# Patient Record
Sex: Female | Born: 1970
Health system: Southern US, Community
[De-identification: ages and names within clinical notes are randomized; demographics above are authoritative.]

## PROBLEM LIST (undated history)

## (undated) DIAGNOSIS — A879 Viral meningitis, unspecified: Secondary | ICD-10-CM

## (undated) DIAGNOSIS — E041 Nontoxic single thyroid nodule: Secondary | ICD-10-CM

## (undated) DIAGNOSIS — D219 Benign neoplasm of connective and other soft tissue, unspecified: Secondary | ICD-10-CM

## (undated) DIAGNOSIS — T7840XA Allergy, unspecified, initial encounter: Secondary | ICD-10-CM

## (undated) HISTORY — DX: Allergy, unspecified, initial encounter: T78.40XA

## (undated) HISTORY — PX: BIOPSY THYROID: PRO38

## (undated) HISTORY — DX: Viral meningitis, unspecified: A87.9

## (undated) HISTORY — DX: Benign neoplasm of connective and other soft tissue, unspecified: D21.9

## (undated) HISTORY — DX: Nontoxic single thyroid nodule: E04.1

---
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
## 1997-10-10 ENCOUNTER — Encounter: Admission: RE | Admit: 1997-10-10 | Discharge: 1998-01-08 | Payer: Self-pay | Admitting: *Deleted

## 1998-03-05 ENCOUNTER — Other Ambulatory Visit: Admission: RE | Admit: 1998-03-05 | Discharge: 1998-03-05 | Payer: Self-pay | Admitting: Obstetrics and Gynecology

## 1999-05-27 ENCOUNTER — Other Ambulatory Visit: Admission: RE | Admit: 1999-05-27 | Discharge: 1999-05-27 | Payer: Self-pay | Admitting: Obstetrics and Gynecology

## 2000-01-10 ENCOUNTER — Inpatient Hospital Stay (HOSPITAL_COMMUNITY): Admission: AD | Admit: 2000-01-10 | Discharge: 2000-01-10 | Payer: Self-pay | Admitting: Obstetrics & Gynecology

## 2000-06-14 ENCOUNTER — Other Ambulatory Visit: Admission: RE | Admit: 2000-06-14 | Discharge: 2000-06-14 | Payer: Self-pay | Admitting: Obstetrics and Gynecology

## 2001-05-17 HISTORY — PX: BREAST BIOPSY: SHX20

## 2001-08-10 ENCOUNTER — Other Ambulatory Visit: Admission: RE | Admit: 2001-08-10 | Discharge: 2001-08-10 | Payer: Self-pay | Admitting: Obstetrics and Gynecology

## 2001-08-18 ENCOUNTER — Encounter: Payer: Self-pay | Admitting: *Deleted

## 2001-08-18 ENCOUNTER — Encounter: Admission: RE | Admit: 2001-08-18 | Discharge: 2001-08-18 | Payer: Self-pay | Admitting: *Deleted

## 2001-09-05 ENCOUNTER — Encounter (INDEPENDENT_AMBULATORY_CARE_PROVIDER_SITE_OTHER): Payer: Self-pay | Admitting: *Deleted

## 2001-09-05 ENCOUNTER — Encounter: Admission: RE | Admit: 2001-09-05 | Discharge: 2001-09-05 | Payer: Self-pay | Admitting: *Deleted

## 2001-09-05 ENCOUNTER — Ambulatory Visit (HOSPITAL_BASED_OUTPATIENT_CLINIC_OR_DEPARTMENT_OTHER): Admission: RE | Admit: 2001-09-05 | Discharge: 2001-09-05 | Payer: Self-pay | Admitting: *Deleted

## 2001-09-05 ENCOUNTER — Encounter: Payer: Self-pay | Admitting: *Deleted

## 2002-03-01 ENCOUNTER — Ambulatory Visit (HOSPITAL_COMMUNITY): Admission: RE | Admit: 2002-03-01 | Discharge: 2002-03-01 | Payer: Self-pay | Admitting: Family Medicine

## 2002-03-09 ENCOUNTER — Encounter: Admission: RE | Admit: 2002-03-09 | Discharge: 2002-03-09 | Payer: Self-pay

## 2002-08-27 ENCOUNTER — Encounter: Admission: RE | Admit: 2002-08-27 | Discharge: 2002-08-27 | Payer: Self-pay | Admitting: *Deleted

## 2002-08-27 ENCOUNTER — Encounter: Payer: Self-pay | Admitting: *Deleted

## 2002-08-30 ENCOUNTER — Other Ambulatory Visit: Admission: RE | Admit: 2002-08-30 | Discharge: 2002-08-30 | Payer: Self-pay | Admitting: Obstetrics and Gynecology

## 2003-01-16 ENCOUNTER — Encounter: Admission: RE | Admit: 2003-01-16 | Discharge: 2003-04-16 | Payer: Self-pay

## 2003-02-27 ENCOUNTER — Encounter: Payer: Self-pay | Admitting: *Deleted

## 2003-02-27 ENCOUNTER — Encounter: Admission: RE | Admit: 2003-02-27 | Discharge: 2003-02-27 | Payer: Self-pay | Admitting: *Deleted

## 2003-08-06 ENCOUNTER — Encounter: Admission: RE | Admit: 2003-08-06 | Discharge: 2003-08-06 | Payer: Self-pay | Admitting: Family Medicine

## 2003-09-09 ENCOUNTER — Encounter: Admission: RE | Admit: 2003-09-09 | Discharge: 2003-09-09 | Payer: Self-pay | Admitting: Obstetrics and Gynecology

## 2003-09-12 ENCOUNTER — Encounter: Admission: RE | Admit: 2003-09-12 | Discharge: 2003-09-12 | Payer: Self-pay | Admitting: Family Medicine

## 2003-09-17 ENCOUNTER — Other Ambulatory Visit: Admission: RE | Admit: 2003-09-17 | Discharge: 2003-09-17 | Payer: Self-pay | Admitting: Obstetrics and Gynecology

## 2003-10-22 ENCOUNTER — Encounter: Admission: RE | Admit: 2003-10-22 | Discharge: 2003-10-22 | Payer: Self-pay | Admitting: Family Medicine

## 2003-10-23 ENCOUNTER — Encounter: Admission: RE | Admit: 2003-10-23 | Discharge: 2003-10-23 | Payer: Self-pay | Admitting: Family Medicine

## 2003-10-29 ENCOUNTER — Encounter (INDEPENDENT_AMBULATORY_CARE_PROVIDER_SITE_OTHER): Payer: Self-pay | Admitting: Specialist

## 2003-10-29 ENCOUNTER — Inpatient Hospital Stay (HOSPITAL_COMMUNITY): Admission: RE | Admit: 2003-10-29 | Discharge: 2003-10-31 | Payer: Self-pay | Admitting: Obstetrics and Gynecology

## 2003-10-29 DIAGNOSIS — D219 Benign neoplasm of connective and other soft tissue, unspecified: Secondary | ICD-10-CM

## 2003-10-29 HISTORY — PX: ABDOMINAL HYSTERECTOMY: SHX81

## 2003-10-29 HISTORY — DX: Benign neoplasm of connective and other soft tissue, unspecified: D21.9

## 2005-03-26 ENCOUNTER — Encounter: Admission: RE | Admit: 2005-03-26 | Discharge: 2005-03-26 | Payer: Self-pay | Admitting: Obstetrics and Gynecology

## 2006-03-07 ENCOUNTER — Encounter: Admission: RE | Admit: 2006-03-07 | Discharge: 2006-03-07 | Payer: Self-pay | Admitting: Gastroenterology

## 2006-04-04 ENCOUNTER — Encounter: Admission: RE | Admit: 2006-04-04 | Discharge: 2006-04-04 | Payer: Self-pay | Admitting: Family Medicine

## 2006-06-25 ENCOUNTER — Emergency Department (HOSPITAL_COMMUNITY): Admission: EM | Admit: 2006-06-25 | Discharge: 2006-06-25 | Payer: Self-pay | Admitting: Family Medicine

## 2007-06-20 ENCOUNTER — Encounter: Admission: RE | Admit: 2007-06-20 | Discharge: 2007-06-20 | Payer: Self-pay | Admitting: Obstetrics & Gynecology

## 2007-12-07 ENCOUNTER — Other Ambulatory Visit: Admission: RE | Admit: 2007-12-07 | Discharge: 2007-12-07 | Payer: Self-pay | Admitting: Obstetrics and Gynecology

## 2008-07-26 ENCOUNTER — Encounter: Admission: RE | Admit: 2008-07-26 | Discharge: 2008-07-26 | Payer: Self-pay | Admitting: Family Medicine

## 2008-07-26 DIAGNOSIS — E041 Nontoxic single thyroid nodule: Secondary | ICD-10-CM

## 2008-07-26 HISTORY — DX: Nontoxic single thyroid nodule: E04.1

## 2008-07-29 ENCOUNTER — Encounter: Admission: RE | Admit: 2008-07-29 | Discharge: 2008-07-29 | Payer: Self-pay | Admitting: Obstetrics & Gynecology

## 2008-08-14 ENCOUNTER — Encounter (INDEPENDENT_AMBULATORY_CARE_PROVIDER_SITE_OTHER): Payer: Self-pay | Admitting: Diagnostic Radiology

## 2008-08-14 ENCOUNTER — Ambulatory Visit (HOSPITAL_COMMUNITY): Admission: RE | Admit: 2008-08-14 | Discharge: 2008-08-14 | Payer: Self-pay | Admitting: Internal Medicine

## 2008-09-12 ENCOUNTER — Encounter (HOSPITAL_COMMUNITY): Admission: RE | Admit: 2008-09-12 | Discharge: 2008-12-11 | Payer: Self-pay | Admitting: Internal Medicine

## 2008-10-01 ENCOUNTER — Encounter (INDEPENDENT_AMBULATORY_CARE_PROVIDER_SITE_OTHER): Payer: Self-pay | Admitting: Interventional Radiology

## 2008-10-01 ENCOUNTER — Encounter: Admission: RE | Admit: 2008-10-01 | Discharge: 2008-10-01 | Payer: Self-pay | Admitting: Internal Medicine

## 2008-10-01 ENCOUNTER — Other Ambulatory Visit: Admission: RE | Admit: 2008-10-01 | Discharge: 2008-10-01 | Payer: Self-pay | Admitting: Interventional Radiology

## 2009-05-02 HISTORY — PX: AUGMENTATION MAMMAPLASTY: SUR837

## 2010-05-06 ENCOUNTER — Encounter
Admission: RE | Admit: 2010-05-06 | Discharge: 2010-05-06 | Payer: Self-pay | Source: Home / Self Care | Attending: Obstetrics & Gynecology | Admitting: Obstetrics & Gynecology

## 2010-06-07 ENCOUNTER — Encounter: Payer: Self-pay | Admitting: Internal Medicine

## 2010-06-07 ENCOUNTER — Encounter: Payer: Self-pay | Admitting: Obstetrics & Gynecology

## 2010-10-02 NOTE — Op Note (Signed)
NAMESEANNE, Breanna Thompson                        ACCOUNT NO.:  [id number]   MEDICAL RECORD NO.:  [id number]                   PATIENT TYPE:  OUT   LOCATION:  MLAB                                 FACILITY:  [facility]   PHYSICIAN:  Laqueta Linden, M.D.                 DATE OF BIRTH:  05/09/1971   DATE OF PROCEDURE:  10/29/2003  DATE OF DISCHARGE:                                 OPERATIVE REPORT   PREOPERATIVE DIAGNOSES:  Leiomyoma uteri with disabling pain.   POSTOPERATIVE DIAGNOSES:  Leiomyoma uteri with disabling pain, probable  torsion and necrosis of fibroids.   PROCEDURE:  Total abdominal hysterectomy, placement of ON-Q catheters x2.   SURGEON:  Laqueta Linden, M.D.   ASSISTANT:  Andres Ege, M.D.   ANESTHESIA:  General endotracheal.   ESTIMATED BLOOD LOSS:  200 mL.   URINE OUTPUT:  150 mL.   FLUIDS:  1500 mL of crystalloid.   COUNTS:  Correct x2.   COMPLICATIONS:  None.   INDICATIONS FOR PROCEDURE:  Breanna Thompson is a 40 year old, gravida 3, para  1 female with large fibroids symptomatic for severe pain consistent with  necrosis, associated menorrhagia, pressure and unremitting pelvic pressure  who desired definitive surgery.  She had been using Vicodin and subsequently  a fentanyl patch for control of the pain but was basically nonfunctional.  She denied any fevers but did have an elevated white count of 14.  She had a  hemoglobin of 12.4.  She elected definitive surgery as soon as possible.  She was counseled regarding alternatives including uterine artery  embolization for myomectomy and as she desired no future childbearing she  elected to proceed with definitive surgery. She saw the informed consent  film, voiced her understanding and acceptance of all risks including but not  limited to anesthesia, infection, bleeding possibly requiring transfusion,  injury to bowel, bladder, ureters, vessels, nerves, possibility of fistula  formation, other postoperative  complications including DVT, PE, pneumonia,  death and other unnamed risks as well as postoperative expectations  regarding hospital stay, return to work, sexual functioning and menopause.  There are no plans to remove her ovaries unless felt absolutely necessary at  the time of surgery. She understands that she will become immediately  menopausal if this was the case and would need to discuss hormone  replacement therapy at that time. She has seen the informed consent film,  voiced her understanding and acceptance of all risks and agrees to proceed.  She received Ancef 1 g IV antibiotic prophylaxis preoperatively and received  Toradol 30 mg IV and 30 mg IM after induction of anesthesia.   DESCRIPTION OF PROCEDURE:  The patient was taken to the operating room and  after proper identification and consents were ascertained, she was placed on  the operating table in the supine position. After the induction of general  endotracheal anesthesia, she was placed in the frog leg  position and the  abdomen, perineum and vagina were prepped and draped in the routine sterile  fashion.  A transurethral Foley was placed, she was returned to the supine  position. A transverse incision above the level of the patient's pannicular  fold was then carried down to the level of the anterior rectus fascia. The  fascia was incised, rectus muscles separated and parietal peritoneum  elevated and incised. The incision was extended superiorly and inferiorly to  the level of the bladder. The entire abdominal cavity below the umbilicus  was replaced by a multilobulated huge fibroid uterus. There were two large  fibroids on either side, two posteriorly and one on the lower uterine  segment.  Prior to placement of the self retaining retractor, it actually  gave better visualization to actually deliver the uterine fundus with the  bulk of the fibroids through the incision and initiate the operation without  the retractor.  The round ligaments were clamped, cut and suture ligated with  dissection carried forward in the anterior leaf of the broad ligament with  dissection of the bladder off of the lower uterine segment and cervix. A  window was made in the posterior broad ligament and curved Heaney clamps  placed across the proximal uterine ovarian ligament and fallopian tube with  this pedicle cut and doubly ligated with a free tie and a stitch of #0  Vicryl with retention of both adnexa.  Both ovaries appeared normal. There  were dilated vessels in both infundibulopelvic ligaments particularly on the  right.  There did not appear to be any hematoma formation and actually the  varicosities appeared to diminish in size as the procedure continued once  the connection to the uterus was cut; however, they were quite impressively  large.  At this point, the uterine vessels were skeletonized and as the  uterus was further elevated above the abdominal incision, curved Heaney  clamps were able to be placed across the uterine vessels perpendicularly at  the level of the internal os.  The pedicle was cut and suture ligated. A  small portion of cardinal ligament was clamped, cut and ligated such that  the ligature included the uterine vessels such that they were doubly  ligated. At this point, the uterine fundus with the huge fibroids was  transected from the top of the cervix which greatly improved visualization.  The self retaining retractor was then placed, bladder blade and bowel  retractor was placed with several moistened lap packs and the operation  continued. Successive straight Heaney clamps were placed across the cardinal  ligaments bilaterally to the level of the upper vaginal angles which were  clamped, cut and Heaney ligated intact.  The cervix was circumscribed with a  scalpel and the remainder of the specimen removed and sent for final sectioning.  Richardson angled sutures were placed at both upper  vaginal  angles for hemostasis. The remainder of the vaginal cuff was closed with  interrupted figure-of-eight sutures of #0 Vicryl. Several small bleeding  points were cauterized. The ureters were both noted to be of normal course  and caliber and peristalsing normally at the conclusion of the procedure.  The tubes and ovaries were suspended from the ipsilateral round ligaments.  Visualization again revealed the aforementioned varicosities bilaterally,  right greater then left, but no evidence of hematoma formation.  Copious  lavage was again accomplished and all pedicles were observed and noted to be  hemostatic. There was no active bleeding noted, hemostasis was felt  to be  excellent. All lap packs were removed, counts were correct prior to closure  of the abdomen. The parietoperitoneum was closed in a running fashion using  2-0 Vicryl sutures. The rectus muscles were loosely reapproximated in the  midline. Subfascial hemostasis was ascertained. An ON-Q catheter was placed  in the left mid quadrant subfascially along the length of the incision. The  fascia was then closed from both lateral aspects in the midline using a  running suture of #0 Maxon. This catheter was dosed with 10 mL of 1% plain  lidocaine as a loading dose. Subcutaneous hemostasis was ascertained and the  skin was closed with staples. Steri-Strips are applied.  Prior to this, a  subcutaneous ON-Q catheter was placed through the right mid quadrant into  the subcutaneous tissues along the length of the incision. After closure of  the incision, the catheter was dosed with 5 mL of 1% plain Xylocaine. Both  catheters were Steri-Stripped and then OpSite was placed to hold them in  place and were attached to the ON-Q bulb with 0.5% Marcaine at a rate of 2  mL per hour per catheter. Steri-Strips were applied to the skin incision  after placement of skin staples.  A pressure dressing was applied, the patient was stable and  extubated on  transfer to the recovery room. Estimated blood loss 200 mL.  Urine output  150 mL.  Fluids 1500 mL crystalloid, counts correct x2.  The entire specimen  including separate cervix and uterus with multiple fibroids was sent to  pathology for final sectioning.                                               Laqueta Linden, M.D.    LKS/MEDQ  D:  10/29/2003  T:  10/29/2003  Job:  [id number]   cc:   Talmadge Coventry, M.D.  [address]  [place]  [place] [address]  Fax: [phone]

## 2010-10-02 NOTE — Op Note (Signed)
[place]  Patient:    Breanna Thompson, Breanna Thompson Visit Number: [id number] MRN: [id number]          Service Type: DSU Location: [facility] Attending Physician:  Kandis Mannan Dictated by:   Donnie Coffin Samuella Cota, M.D. Proc. Date: 09/05/01 Admit Date:  09/05/2001 Discharge Date: 09/05/2001   CC:         Heather Roberts, M.D.  Richard M. Marcelle Overlie, M.D.   Operative Report  CCS NUMBER:  [id number]  PREOPERATIVE DIAGNOSIS:  Abnormal mammogram, right breast.  POSTOPERATIVE DIAGNOSIS:  Abnormal mammogram, right breast.  OPERATION:  Needle localization and biopsy of right breast.  SURGEON:  Maisie Fus B. Samuella Cota, M.D.  ANESTHESIA:  Xylocaine 1% local.  ANESTHESIOLOGIST AND C.R.N.A.:  Ashley Royalty.  DESCRIPTION OF PROCEDURE:  Patient was taken to the operating room and placed on the table in supine position.  The right breast was prepped and draped as a sterile field.  A wire was coming through the skin at about the 10:30 position.  An elliptical incision was outlined with the skin marker so as to remove the opening in the skin made by the wire.  Xylocaine 1% local was then used to infiltrate the skin and underlying breast tissue.  The elliptical incision was made and using mainly the cautery, a core of breast tissue was taken out using the wire as a guide.  The patient had a very fatty breast with very little bleeding and very little breast tissue.  The core was removed which surrounded the wire and went beyond the tip of the wire.  Specimen was sent to the radiologist for specimen mammography.  While this was being done, bleeding was controlled with the cautery.  Subcutaneous tissue was closed with interrupted sutures of 3-0 Vicryl and the skin was closed with a running subcuticular suture of 4-0 Vicryl.  The radiologist reported that the area of concern had been removed.  The wound was then painted with Benzoin and half-inch Steri-Strips were applied.  Dry sterile  dressing using 4 x 4s, ABD and four-inch Hypafix was applied.  Patient seemed to tolerate procedure well and was taken to the PACU in satisfactory condition. Dictated by:   Donnie Coffin Samuella Cota, M.D. Attending Physician:  Kandis Mannan DD:  09/05/01 TD:  09/07/01 Job: [id number]

## 2011-04-13 ENCOUNTER — Other Ambulatory Visit: Payer: Self-pay | Admitting: Obstetrics and Gynecology

## 2011-04-13 ENCOUNTER — Other Ambulatory Visit: Payer: Self-pay | Admitting: Obstetrics & Gynecology

## 2011-04-13 DIAGNOSIS — Z1231 Encounter for screening mammogram for malignant neoplasm of breast: Secondary | ICD-10-CM

## 2011-04-17 LAB — HM COLONOSCOPY

## 2011-04-19 ENCOUNTER — Other Ambulatory Visit: Payer: Self-pay | Admitting: Family Medicine

## 2011-04-19 DIAGNOSIS — E041 Nontoxic single thyroid nodule: Secondary | ICD-10-CM

## 2011-04-21 ENCOUNTER — Ambulatory Visit
Admission: RE | Admit: 2011-04-21 | Discharge: 2011-04-21 | Disposition: A | Discharge status: Home / Self Care | Payer: BC Managed Care – PPO | Source: Ambulatory Visit | Attending: Family Medicine | Admitting: Family Medicine

## 2011-04-21 DIAGNOSIS — E041 Nontoxic single thyroid nodule: Secondary | ICD-10-CM

## 2011-05-13 ENCOUNTER — Ambulatory Visit: Payer: Self-pay

## 2011-05-20 ENCOUNTER — Ambulatory Visit: Payer: Self-pay

## 2011-05-25 ENCOUNTER — Ambulatory Visit
Admission: RE | Admit: 2011-05-25 | Discharge: 2011-05-25 | Disposition: A | Discharge status: Home / Self Care | Payer: BC Managed Care – PPO | Source: Ambulatory Visit | Attending: Obstetrics & Gynecology | Admitting: Obstetrics & Gynecology

## 2011-05-25 DIAGNOSIS — Z1231 Encounter for screening mammogram for malignant neoplasm of breast: Secondary | ICD-10-CM

## 2012-06-15 ENCOUNTER — Other Ambulatory Visit: Payer: Self-pay | Admitting: Obstetrics & Gynecology

## 2012-06-15 DIAGNOSIS — Z1231 Encounter for screening mammogram for malignant neoplasm of breast: Secondary | ICD-10-CM

## 2012-07-13 ENCOUNTER — Ambulatory Visit
Admission: RE | Admit: 2012-07-13 | Discharge: 2012-07-13 | Disposition: A | Discharge status: Home / Self Care | Payer: BC Managed Care – PPO | Source: Ambulatory Visit | Attending: Obstetrics & Gynecology | Admitting: Obstetrics & Gynecology

## 2012-07-13 DIAGNOSIS — Z1231 Encounter for screening mammogram for malignant neoplasm of breast: Secondary | ICD-10-CM

## 2012-07-17 ENCOUNTER — Other Ambulatory Visit: Payer: Self-pay | Admitting: Obstetrics and Gynecology

## 2012-07-17 DIAGNOSIS — Z1231 Encounter for screening mammogram for malignant neoplasm of breast: Secondary | ICD-10-CM

## 2012-08-21 ENCOUNTER — Ambulatory Visit
Admission: RE | Admit: 2012-08-21 | Discharge: 2012-08-21 | Disposition: A | Discharge status: Home / Self Care | Payer: BC Managed Care – PPO | Source: Ambulatory Visit | Attending: Obstetrics and Gynecology | Admitting: Obstetrics and Gynecology

## 2012-08-21 DIAGNOSIS — Z1231 Encounter for screening mammogram for malignant neoplasm of breast: Secondary | ICD-10-CM

## 2013-01-19 ENCOUNTER — Ambulatory Visit (INDEPENDENT_AMBULATORY_CARE_PROVIDER_SITE_OTHER): Payer: BC Managed Care – PPO | Admitting: Obstetrics and Gynecology

## 2013-01-19 ENCOUNTER — Encounter: Payer: Self-pay | Admitting: Obstetrics and Gynecology

## 2013-01-19 VITALS — BP 100/70 | HR 64 | Ht 63.5 in | Wt 223.5 lb

## 2013-01-19 DIAGNOSIS — R8290 Unspecified abnormal findings in urine: Secondary | ICD-10-CM

## 2013-01-19 DIAGNOSIS — R82998 Other abnormal findings in urine: Secondary | ICD-10-CM

## 2013-01-19 LAB — POCT URINALYSIS DIPSTICK
Blood, UA: NEGATIVE
Ketones, UA: NEGATIVE
Leukocytes, UA: NEGATIVE
Protein, UA: NEGATIVE
pH, UA: 5

## 2013-01-19 NOTE — Patient Instructions (Addendum)
We will send you results from the urine culture through My Chart.

## 2013-01-19 NOTE — Progress Notes (Signed)
Patient ID: Breanna Thompson, female   DOB: Nov 06, 1970, 42 y.o.   MRN: [id number]  42 y.o.   Married    [place]   female   [phone]   here for foul odor in the urine. Status post TAH for fibroids.  Usually when has a UTI develops foul urine odor.  Not consistent.  More noticeable first thing in the am.   This happened in June. Went to Urgent Care and given an antibiotic, but the patient never received a final culture report. Has maybe one UTI per year.   No vaginal drainage or burning. No vaginal bleeding as has had hysterectomy.  No new partners.  Same partner for the last 2 1/2 years.  No douching.    New new meds or diagnoses.  Drinks green tea consistently.  Usually drinks water.    Patient's last menstrual period was 10/20/2003.          Sexually active: yes  The current method of family planning is status post hysterectomy.     Family History  Problem Relation Age of Onset  . Colon cancer Paternal Aunt     dx'd age 8-died age 53  . Colon cancer Paternal Aunt     dx'd 47 stage IV  . Hypertension Mother   . Diabetes Father   . Hypertension Father     There are no active problems to display for this patient.   Past Medical History  Diagnosis Date  . Fibroid     hx of--removed with hyst.  . Viral meningitis 1990    Past Surgical History  Procedure Laterality Date  . Abdominal hysterectomy  10-29-03    TAH d/t fibroids--Dr. Myrlene Broker  . Breast biopsy Right 2003    benign  . Augmentation mammaplasty Bilateral 05-02-2009    w/lift--Saline    Allergies: Review of patient's allergies indicates no known allergies.  Current Outpatient Prescriptions  Medication Sig Dispense Refill  . cholecalciferol (VITAMIN D) 1000 UNITS tablet Take 1,000 Units by mouth 2 (two) times daily.      . Multiple Vitamin (MULTIVITAMIN) capsule Take 1 capsule by mouth daily.       No current facility-administered medications for this visit.    ROS: Pertinent items are noted  in HPI.  Exam:    BP 100/70  Pulse 64  Ht 5' 3.5" (1.613 m)  Wt 223 lb 8 oz (101.379 kg)  BMI 38.97 kg/m2  LMP 10/20/2003   Wt Readings from Last 3 Encounters:  01/19/13 223 lb 8 oz (101.379 kg)     Ht Readings from Last 3 Encounters:  01/19/13 5' 3.5" (1.613 m)    General appearance: alert, cooperative and appears stated age Abdomen: soft, non-tender; bowel sounds normal; no masses,  no organomegaly   Pelvic: External genitalia:  no lesions              Urethra:  normal appearing urethra with no masses, tenderness or lesions              Bartholins and Skenes: normal                 Vagina: normal appearing vagina with normal color and discharge, no lesions              Cervix: absent        Bimanual Exam:  Uterus:   absent  Adnexa: normal adnexa in size, nontender and no masses                                       Wet Prep - pH 4.5, no yeast, clue cells, or trichomonas.  Assessment  Foul odor to the urine. No vaginitis.  Plan  Urine culture. Discussed ingestion of foods or beverages as a source of the odor. Discussed hydration to reduce concentration of the urine.  Return prn.   An After Visit Summary was printed and given to the patient.

## 2013-01-22 ENCOUNTER — Telehealth: Payer: Self-pay

## 2013-01-22 ENCOUNTER — Other Ambulatory Visit: Payer: Self-pay | Admitting: Obstetrics and Gynecology

## 2013-01-22 LAB — URINE CULTURE

## 2013-01-22 MED ORDER — NITROFURANTOIN MONOHYD MACRO 100 MG PO CAPS: 100.0000 mg | ORAL_CAPSULE | Freq: Two times a day (BID) | ORAL | Status: DC

## 2013-01-22 NOTE — Telephone Encounter (Signed)
Patient's pharmacy is [facility] on [address].

## 2013-01-22 NOTE — Telephone Encounter (Signed)
Called patient LMOVM requestion name and # for her pharmacy.

## 2013-02-02 ENCOUNTER — Ambulatory Visit (INDEPENDENT_AMBULATORY_CARE_PROVIDER_SITE_OTHER): Payer: BC Managed Care – PPO | Admitting: *Deleted

## 2013-02-02 VITALS — BP 90/60 | HR 80 | Resp 20 | Ht 63.5 in | Wt 223.0 lb

## 2013-02-02 DIAGNOSIS — N390 Urinary tract infection, site not specified: Secondary | ICD-10-CM

## 2013-02-02 NOTE — Progress Notes (Signed)
Patient ID: Breanna Thompson, female   DOB: [birth year], 42 y.o.   MRN: [id number] Pt arrived for urine TOC.  Pt states she completed antibiotics and feels much better.  Urine odor has gone away.  Sample collected and sent for culture.

## 2013-02-03 LAB — URINE CULTURE: Colony Count: NO GROWTH

## 2013-02-15 ENCOUNTER — Other Ambulatory Visit: Payer: Self-pay | Admitting: Family Medicine

## 2013-02-15 DIAGNOSIS — E042 Nontoxic multinodular goiter: Secondary | ICD-10-CM

## 2013-02-16 ENCOUNTER — Other Ambulatory Visit: Payer: BC Managed Care – PPO

## 2013-03-12 ENCOUNTER — Ambulatory Visit
Admission: RE | Admit: 2013-03-12 | Discharge: 2013-03-12 | Disposition: A | Discharge status: Home / Self Care | Payer: BC Managed Care – PPO | Source: Ambulatory Visit | Attending: Family Medicine | Admitting: Family Medicine

## 2013-03-12 DIAGNOSIS — E042 Nontoxic multinodular goiter: Secondary | ICD-10-CM

## 2013-06-25 ENCOUNTER — Other Ambulatory Visit: Payer: Self-pay | Admitting: Internal Medicine

## 2013-06-25 DIAGNOSIS — E042 Nontoxic multinodular goiter: Secondary | ICD-10-CM

## 2013-06-26 ENCOUNTER — Ambulatory Visit: Payer: Self-pay | Admitting: Nurse Practitioner

## 2013-06-26 ENCOUNTER — Ambulatory Visit (INDEPENDENT_AMBULATORY_CARE_PROVIDER_SITE_OTHER): Payer: BC Managed Care – PPO | Admitting: Certified Nurse Midwife

## 2013-06-26 ENCOUNTER — Encounter: Payer: Self-pay | Admitting: Certified Nurse Midwife

## 2013-06-26 VITALS — BP 100/68 | HR 70 | Resp 16 | Ht 64.0 in | Wt 225.0 lb

## 2013-06-26 DIAGNOSIS — Z0000 Encounter for general adult medical examination without abnormal findings: Secondary | ICD-10-CM

## 2013-06-26 DIAGNOSIS — Z01419 Encounter for gynecological examination (general) (routine) without abnormal findings: Secondary | ICD-10-CM

## 2013-06-26 LAB — POCT URINALYSIS DIPSTICK
BILIRUBIN UA: NEGATIVE
Glucose, UA: NEGATIVE
KETONES UA: NEGATIVE
Leukocytes, UA: NEGATIVE
Nitrite, UA: NEGATIVE
PH UA: 5
Protein, UA: NEGATIVE
RBC UA: NEGATIVE
Urobilinogen, UA: NEGATIVE

## 2013-06-26 NOTE — Progress Notes (Signed)
43 y.o. G3P0021 Married African American Fe here for annual exam. Denies vaginal bleeding or vaginal dryness. Sees PCP annual for aex, labs. Sees Endocrine for follow up of thyroid nodules, with another appointment soon. No health issues today.  Patient's last menstrual period was 10/20/2003.          Sexually active: yes  The current method of family planning is status post hysterectomy.    Exercising: yes  cardio & toning Smoker:  no  Health Maintenance: Pap:  09-17-03 neg MMG:  08-21-2012 neg Colonoscopy: 12/12 normal repeat 7 years BMD:   none TDaP:  2009 Labs: Poct urine-neg Self breast exam: done occ   reports that she has never smoked. She does not have any smokeless tobacco history on file. She reports that she drinks alcohol. She reports that she does not use illicit drugs.  Past Medical History  Diagnosis Date  . Fibroid     hx of--removed with hyst.  . Viral meningitis 1990  . Thyroid nodule     both sides    Past Surgical History  Procedure Laterality Date  . Abdominal hysterectomy  10-29-03    TAH d/t fibroids--Dr. Margaretha Glassing  . Breast biopsy Right 2003    benign  . Augmentation mammaplasty Bilateral 05-02-2009    w/lift--Saline  . Biopsy thyroid      needle biopsy done on rt side    Current Outpatient Prescriptions  Medication Sig Dispense Refill  . Betamethasone Valerate 0.12 % foam 2 (two) times a week.      . cholecalciferol (VITAMIN D) 1000 UNITS tablet Take 1,000 Units by mouth 2 (two) times daily.      . Clindamycin-Benzoyl Per, Refr, gel as needed.      . Multiple Vitamin (MULTIVITAMIN) capsule Take 1 capsule by mouth daily.      . vitamin C (ASCORBIC ACID) 500 MG tablet Take 500 mg by mouth daily.       No current facility-administered medications for this visit.    Family History  Problem Relation Age of Onset  . Colon cancer Paternal Aunt     dx'd age 74-died age 5  . Colon cancer Paternal Aunt     dx'd 74 stage IV  . Hypertension Mother    . Diabetes Father   . Hypertension Father   . Colon cancer Paternal Aunt     ROS:  Pertinent items are noted in HPI.  Otherwise, a comprehensive ROS was negative.  Exam:   BP 100/68  Pulse 70  Resp 16  Ht 5\' 4"  (1.626 m)  Wt 225 lb (102.059 kg)  BMI 38.60 kg/m2  LMP 10/20/2003 Height: 5\' 4"  (162.6 cm)  Ht Readings from Last 3 Encounters:  06/26/13 5\' 4"  (1.626 m)  02/02/13 5' 3.5" (1.613 m)  01/19/13 5' 3.5" (1.613 m)    General appearance: alert, cooperative and appears stated age Head: Normocephalic, without obvious abnormality, atraumatic Neck: no adenopathy, supple, symmetrical, trachea midline and thyroid enlarged and nodular Lungs: clear to auscultation bilaterally Breasts: normal appearance, no masses or tenderness, No nipple retraction or dimpling, No nipple discharge or bleeding, No axillary or supraclavicular adenopathy, augmentation scarring noted, implants feel intact Heart: regular rate and rhythm Abdomen: soft, non-tender; no masses,  no organomegaly Extremities: extremities normal, atraumatic, no cyanosis or edema Skin: Skin color, texture, turgor normal. No rashes or lesions Lymph nodes: Cervical, supraclavicular, and axillary nodes normal. No abnormal inguinal nodes palpated Neurologic: Grossly normal   Pelvic: External genitalia:  no  lesions              Urethra:  normal appearing urethra with no masses, tenderness or lesions              Bartholin's and Skene's: normal                 Vagina: normal appearing vagina with normal color and discharge, no lesions              Cervix: absent              Pap taken: no Bimanual Exam:  Uterus:  uterus absent              Adnexa: normal adnexa and no mass, fullness, tenderness               Rectovaginal: Confirms               Anus:  normal sphincter tone, no lesions  A:  Well Woman with normal exam  Thyroid nodules under follow up with PCP  S/P TAH ovaries retained for fibroids  P:   Reviewed health and  wellness pertinent to exam  Pap smear as per guidelines   Mammogram yearly pap smear not taken today  counseled on breast self exam, mammography screening, menopause, adequate intake of calcium and vitamin D, diet and exercise, Kegel's exercises  return annually or prn  An After Visit Summary was printed and given to the patient.

## 2013-06-26 NOTE — Patient Instructions (Signed)

## 2013-06-29 NOTE — Progress Notes (Signed)
Reviewed personally.  M. Suzanne Brytani Voth, MD.  

## 2013-07-11 ENCOUNTER — Other Ambulatory Visit (HOSPITAL_COMMUNITY)
Admission: RE | Admit: 2013-07-11 | Discharge: 2013-07-11 | Disposition: A | Discharge status: Home / Self Care | Payer: BC Managed Care – PPO | Source: Ambulatory Visit | Attending: Interventional Radiology | Admitting: Interventional Radiology

## 2013-07-11 ENCOUNTER — Ambulatory Visit
Admission: RE | Admit: 2013-07-11 | Discharge: 2013-07-11 | Disposition: A | Discharge status: Home / Self Care | Payer: BC Managed Care – PPO | Source: Ambulatory Visit | Attending: Internal Medicine | Admitting: Internal Medicine

## 2013-07-11 DIAGNOSIS — E049 Nontoxic goiter, unspecified: Secondary | ICD-10-CM | POA: Insufficient documentation

## 2013-07-11 DIAGNOSIS — E042 Nontoxic multinodular goiter: Secondary | ICD-10-CM

## 2013-07-17 ENCOUNTER — Ambulatory Visit: Payer: Self-pay | Admitting: Nurse Practitioner

## 2013-08-09 ENCOUNTER — Other Ambulatory Visit: Payer: Self-pay | Admitting: Obstetrics and Gynecology

## 2013-08-09 ENCOUNTER — Other Ambulatory Visit: Payer: Self-pay

## 2013-08-09 DIAGNOSIS — Z1231 Encounter for screening mammogram for malignant neoplasm of breast: Secondary | ICD-10-CM

## 2013-08-31 ENCOUNTER — Ambulatory Visit
Admission: RE | Admit: 2013-08-31 | Discharge: 2013-08-31 | Disposition: A | Discharge status: Home / Self Care | Payer: BC Managed Care – PPO | Source: Ambulatory Visit

## 2013-08-31 DIAGNOSIS — Z1231 Encounter for screening mammogram for malignant neoplasm of breast: Secondary | ICD-10-CM

## 2014-03-18 ENCOUNTER — Encounter: Payer: Self-pay | Admitting: Certified Nurse Midwife

## 2014-04-05 IMAGING — US US SOFT TISSUE HEAD/NECK
1 series · 14 of 25 positions shown · non-contrast
Comparison: Ultrasound 04/21/2011

CLINICAL DATA: Followup nodules.

EXAM:
THYROID ULTRASOUND
TECHNIQUE: Ultrasound examination of the thyroid gland and adjacent soft
tissues was performed.

[Series 1: us soft tissue head/neck · 0.11mm/px · 14 of 58 slices shown]
[im 1/58]
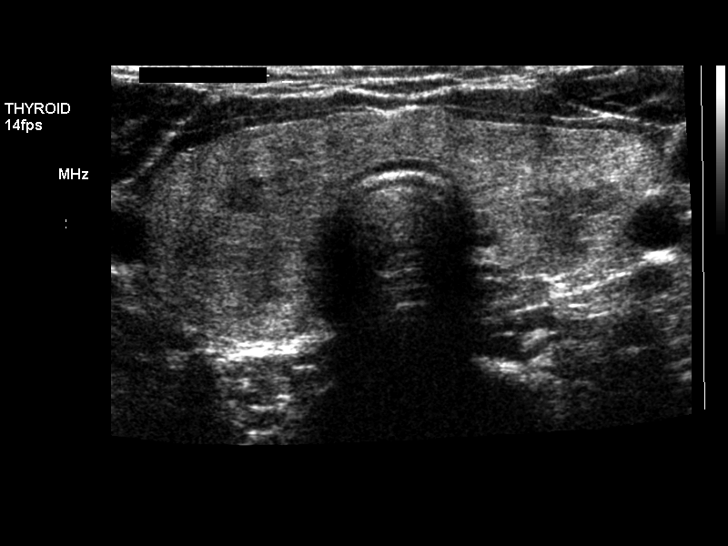
[im 5/58]
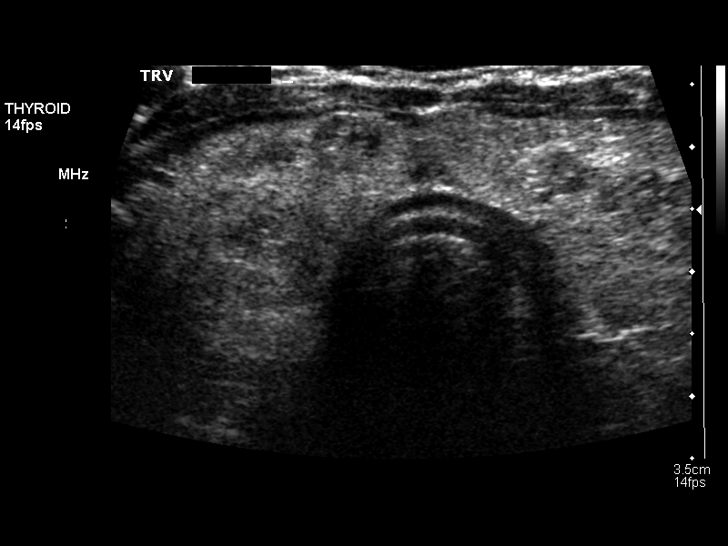
[im 10/58]
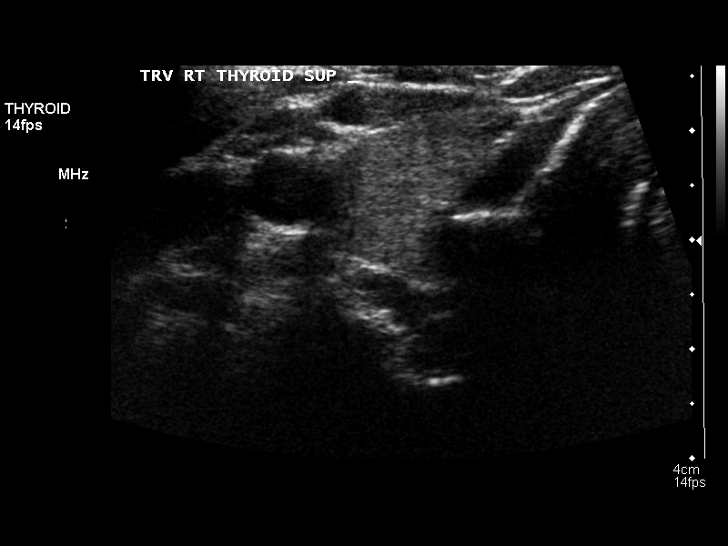
[im 15/58]
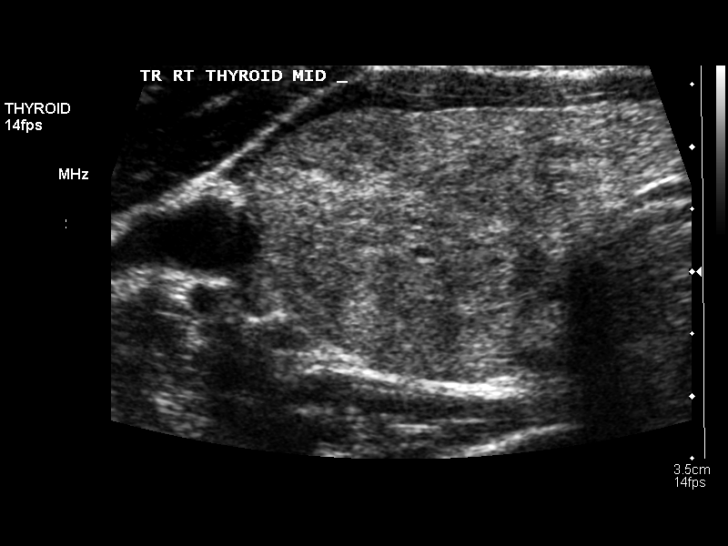
[im 20/58]
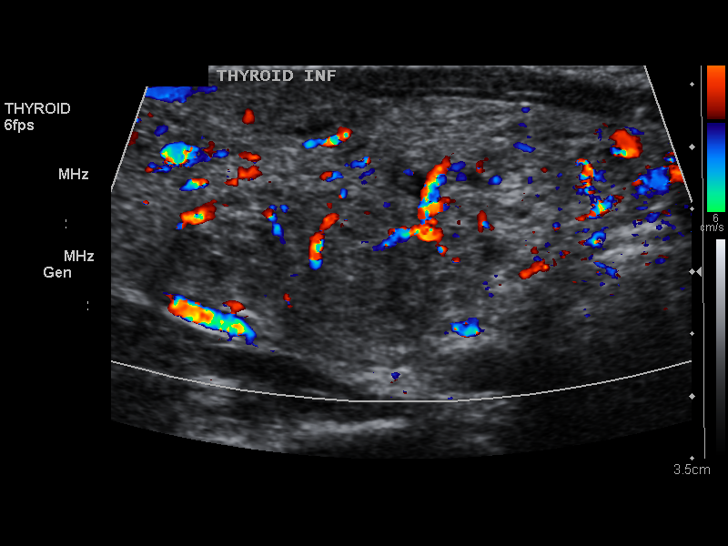
[im 22/58]
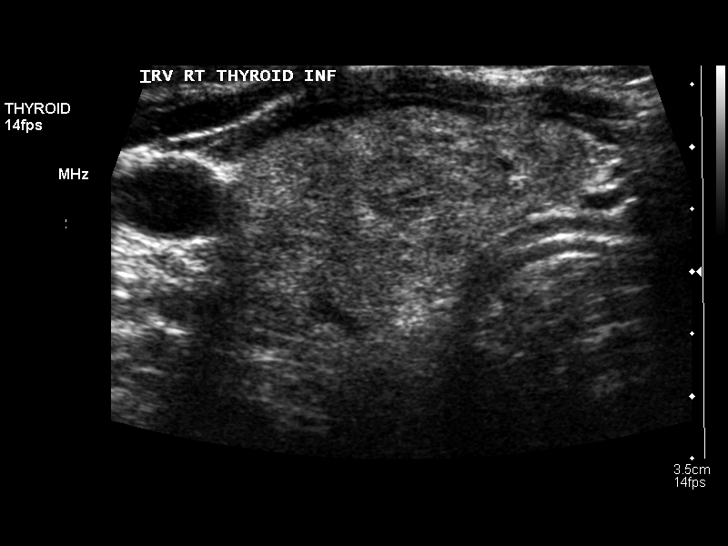
[im 27/58]
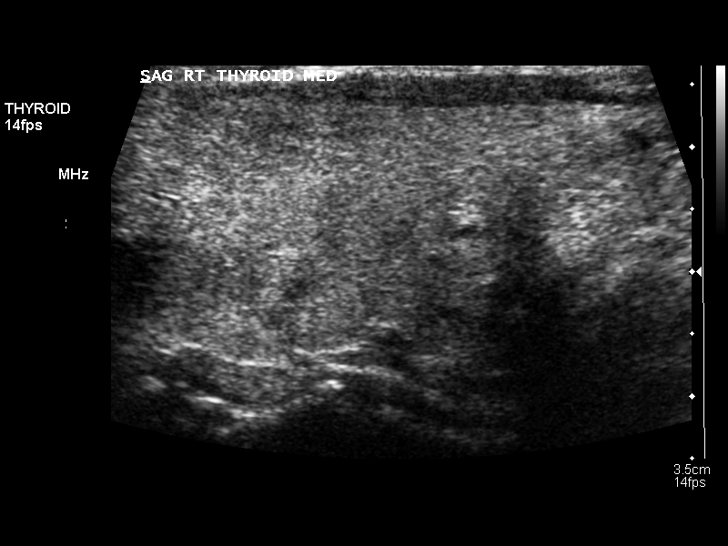
[im 31/58]
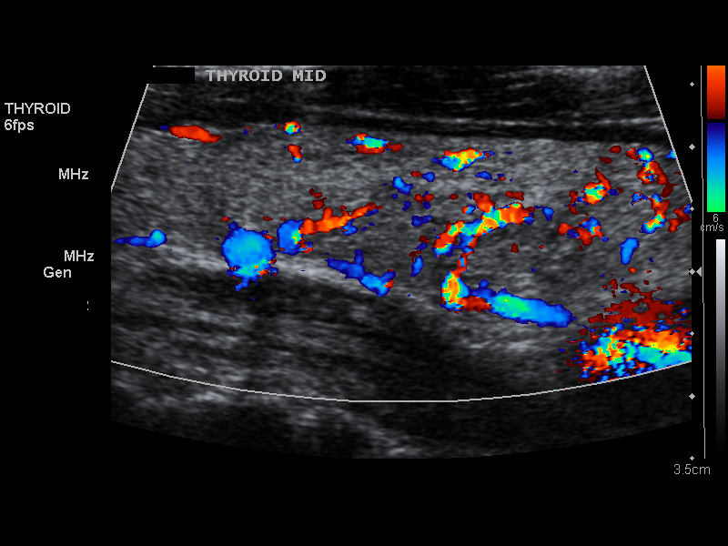
[im 36/58]
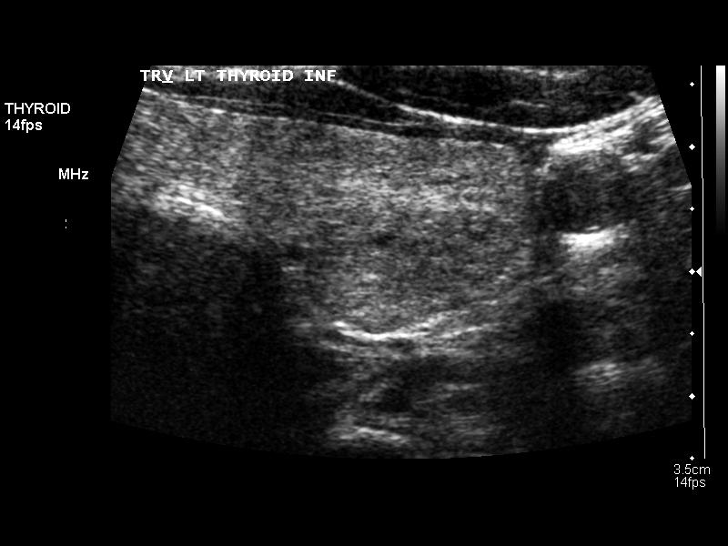
[im 39/58]
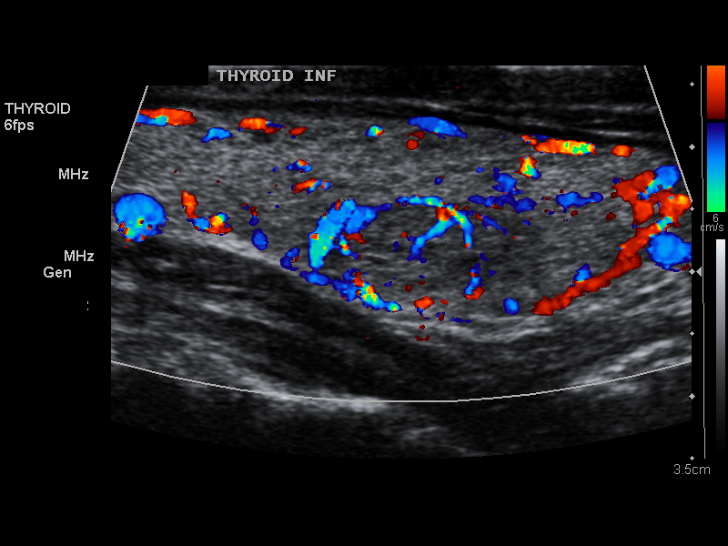
[im 43/58]
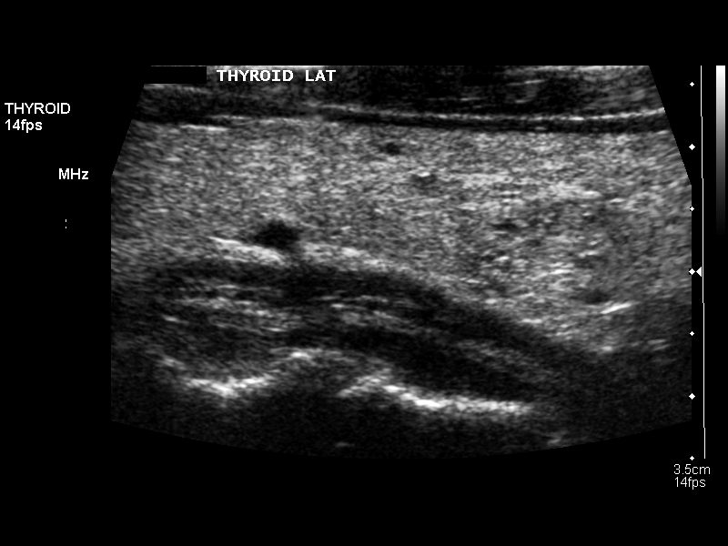
[im 48/58]
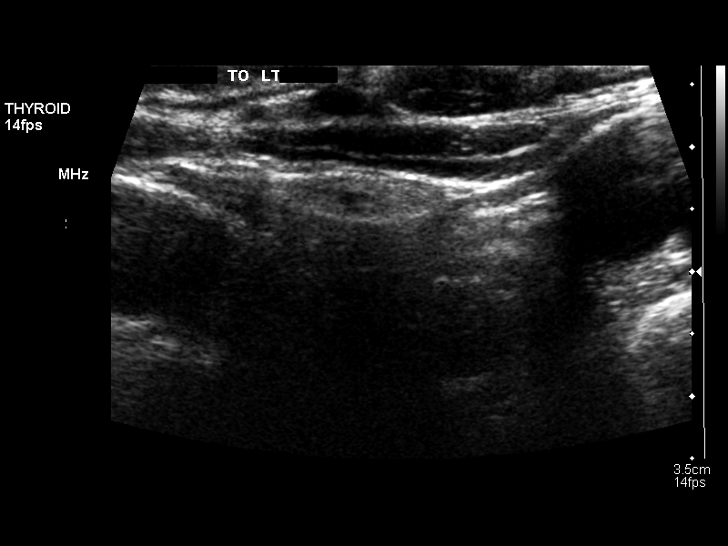
[im 53/58]
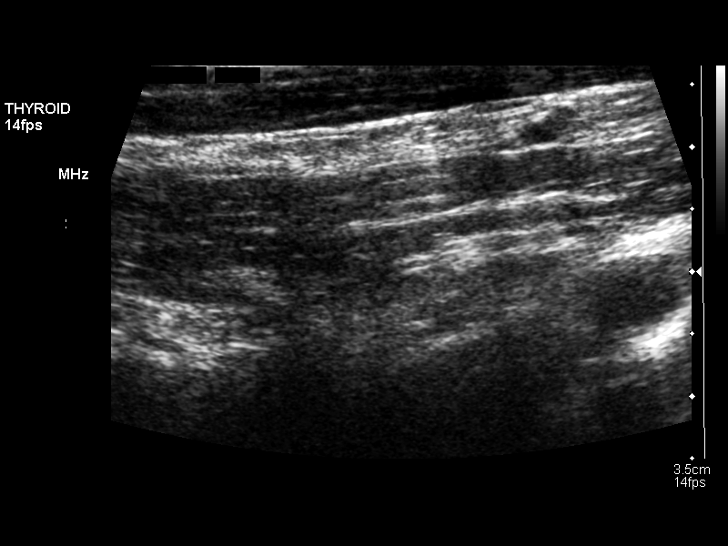
[im 58/58]
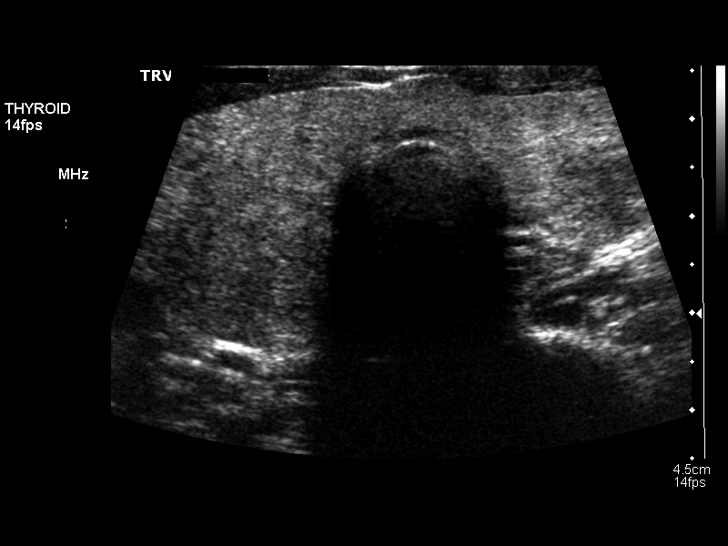

[14 of 25 positions shown; findings below may reference images not displayed]

FINDINGS: Right thyroid lobe

Measurements: 7.1 x 2.3 x 2.6 cm.. Solid nodules are 0.7 cm, 0.9 cm,
and 2.3 x 3.9 x 1.4 cm. The largest is in the lower pole.
Previously, this measured 2.1 x 1.7 x 2.5 cm. This has apparently
been biopsied in [id number].

Left thyroid lobe

Measurements: 6.6 x 1.7 x 2.5 cm.. Solid nodules are 0.8 cm and
x 1.0 x 1.3 cm, previously 1.5 x 0.6 x 1.0 cm.

Isthmus

Thickness: 0.8 cm. Solid nodule is 0.6 cm

Lymphadenopathy

None visualized.
IMPRESSION: 1. Bilateral solid nodules.
2. Correlation is recommended regarding previous cytology report
from right lower lobe nodule biopsy. This nodule is slightly larger
on the current study.
3. Nodule in the lower pole of the left lobe appears stable but does
meet criteria for biopsy. This could be considered if not already
performed.

## 2014-06-26 ENCOUNTER — Other Ambulatory Visit: Payer: Self-pay | Admitting: Internal Medicine

## 2014-06-26 DIAGNOSIS — E042 Nontoxic multinodular goiter: Secondary | ICD-10-CM

## 2014-06-27 ENCOUNTER — Ambulatory Visit
Admission: RE | Admit: 2014-06-27 | Discharge: 2014-06-27 | Disposition: A | Discharge status: Home / Self Care | Payer: BLUE CROSS/BLUE SHIELD | Source: Ambulatory Visit | Attending: Internal Medicine | Admitting: Internal Medicine

## 2014-06-27 ENCOUNTER — Ambulatory Visit (INDEPENDENT_AMBULATORY_CARE_PROVIDER_SITE_OTHER): Payer: BLUE CROSS/BLUE SHIELD | Admitting: Nurse Practitioner

## 2014-06-27 ENCOUNTER — Encounter: Payer: Self-pay | Admitting: Nurse Practitioner

## 2014-06-27 VITALS — BP 100/62 | HR 76 | Ht 64.0 in | Wt 218.0 lb

## 2014-06-27 DIAGNOSIS — Z800 Family history of malignant neoplasm of digestive organs: Secondary | ICD-10-CM

## 2014-06-27 DIAGNOSIS — Z0000 Encounter for general adult medical examination without abnormal findings: Secondary | ICD-10-CM

## 2014-06-27 DIAGNOSIS — Z90710 Acquired absence of both cervix and uterus: Secondary | ICD-10-CM

## 2014-06-27 DIAGNOSIS — E042 Nontoxic multinodular goiter: Secondary | ICD-10-CM

## 2014-06-27 DIAGNOSIS — E041 Nontoxic single thyroid nodule: Secondary | ICD-10-CM

## 2014-06-27 DIAGNOSIS — Z01419 Encounter for gynecological examination (general) (routine) without abnormal findings: Secondary | ICD-10-CM

## 2014-06-27 LAB — POCT URINALYSIS DIPSTICK
BILIRUBIN UA: NEGATIVE
Blood, UA: NEGATIVE
GLUCOSE UA: NEGATIVE
KETONES UA: NEGATIVE
LEUKOCYTES UA: NEGATIVE
NITRITE UA: NEGATIVE
PROTEIN UA: NEGATIVE
UROBILINOGEN UA: NEGATIVE
pH, UA: 5

## 2014-06-27 NOTE — Progress Notes (Signed)
Patient ID: Breanna Thompson, female   DOB: 1970/06/13, 44 y.o.   MRN: [id number] 44 y.o. [id number] Divorced  African American Fe here for annual exam.  No vaso symptoms.  No vaginal dryness.  Same partner 4 years. Endocrinologist is following for thyroid nodules.  Patient's last menstrual period was 10/20/2003.          Sexually active: Yes.    The current method of family planning is status post hysterectomy.    Exercising: No.  The patient does not participate in regular exercise at present. Smoker:  no  Health Maintenance: Pap:  09/17/03, negative (hyst in 10/29/2003) MMG:  08/31/13, Bi-Rads 2:  benign Colonoscopy:   04/2011, normal, repeat 7 years due to family history TDaP:  02/2008 Labs:   HB:  PCP  Urine:  negative   reports that she has never smoked. She does not have any smokeless tobacco history on file. She reports that she drinks alcohol. She reports that she does not use illicit drugs.  Past Medical History  Diagnosis Date  . Fibroid     hx of--removed with hyst.  . Viral meningitis 1990  . Thyroid nodule     both sides    Past Surgical History  Procedure Laterality Date  . Abdominal hysterectomy  10-29-03    TAH d/t fibroids--Dr. Margaretha Glassing  . Breast biopsy Right 2003    benign  . Augmentation mammaplasty Bilateral 05-02-2009    w/lift--[place]  . Biopsy thyroid      needle biopsy done on rt side    Current Outpatient Prescriptions  Medication Sig Dispense Refill  . Betamethasone Valerate 0.12 % foam 2 (two) times a week.    . cholecalciferol (VITAMIN D) 1000 UNITS tablet Take 1,000 Units by mouth 2 (two) times daily.    . Clindamycin-Benzoyl Per, Refr, gel as needed.    . Multiple Vitamin (MULTIVITAMIN) capsule Take 1 capsule by mouth daily.    . vitamin C (ASCORBIC ACID) 500 MG tablet Take 500 mg by mouth daily.     No current facility-administered medications for this visit.    Family History  Problem Relation Age of Onset  . Colon cancer Paternal Aunt      dx'd age 74-died age 26  . Colon cancer Paternal Aunt     dx'd 58 stage IV  . Hypertension Mother   . Diabetes Mother   . Diabetes Father   . Hypertension Father   . Colon cancer Paternal Aunt     ROS:  Pertinent items are noted in HPI.  Otherwise, a comprehensive ROS was negative.  Exam:   BP 100/62 mmHg  Pulse 76  Ht 5\' 4"  (1.626 m)  Wt 218 lb (98.884 kg)  BMI 37.40 kg/m2  LMP 10/20/2003 Height: 5\' 4"  (162.6 cm) Ht Readings from Last 3 Encounters:  06/27/14 5\' 4"  (1.626 m)  06/26/13 5\' 4"  (1.626 m)  02/02/13 5' 3.5" (1.613 m)    General appearance: alert, cooperative and appears stated age Head: Normocephalic, without obvious abnormality, atraumatic Neck: no adenopathy, supple, symmetrical, trachea midline and thyroid normal to inspection and palpation with small nodule Lungs: clear to auscultation bilaterally Breasts: normal appearance, no masses or tenderness, positive findings: implants bilaterally with normal scarring Heart: regular rate and rhythm Abdomen: soft, non-tender; no masses,  no organomegaly Extremities: extremities normal, atraumatic, no cyanosis or edema Skin: Skin color, texture, turgor normal. No rashes or lesions Lymph nodes: Cervical, supraclavicular, and axillary nodes normal. No abnormal inguinal nodes palpated  Neurologic: Grossly normal   Pelvic: External genitalia:  no lesions              Urethra:  normal appearing urethra with no masses, tenderness or lesions              Bartholin's and Skene's: normal                 Vagina: normal appearing vagina with normal color and discharge, no lesions              Cervix: absent              Pap taken: No. Bimanual Exam:  Uterus:  uterus absent              Adnexa: no mass, fullness, tenderness               Rectovaginal: Confirms               Anus:  normal sphincter tone, no lesions  Chaperone present:  no  A:  Well Woman with normal exam  Thyroid nodules under follow up with  PCP S/P TAH 10/29/2003 for fibroids  S/P Breat augmentation with lift 05/02/09  [place]: colon cancer - 3 PA    P:   Reviewed health and wellness pertinent to exam  Pap smear not taken today  Mammogram is due 4/16  Counseled on breast self exam, mammography screening, adequate intake of calcium and vitamin D, diet and exercise return annually or prn  An After Visit Summary was printed and given to the patient.

## 2014-06-27 NOTE — Patient Instructions (Signed)

## 2014-06-28 NOTE — Progress Notes (Signed)
Reviewed personally.  M. Suzanne Denny Mccree, MD.  

## 2014-08-08 ENCOUNTER — Other Ambulatory Visit: Payer: Self-pay

## 2014-08-08 DIAGNOSIS — Z1231 Encounter for screening mammogram for malignant neoplasm of breast: Secondary | ICD-10-CM

## 2014-09-03 ENCOUNTER — Ambulatory Visit
Admission: RE | Admit: 2014-09-03 | Discharge: 2014-09-03 | Disposition: A | Discharge status: Home / Self Care | Payer: BLUE CROSS/BLUE SHIELD | Source: Ambulatory Visit

## 2014-09-03 ENCOUNTER — Other Ambulatory Visit: Payer: Self-pay

## 2014-09-03 DIAGNOSIS — Z1231 Encounter for screening mammogram for malignant neoplasm of breast: Secondary | ICD-10-CM

## 2015-04-26 ENCOUNTER — Ambulatory Visit (INDEPENDENT_AMBULATORY_CARE_PROVIDER_SITE_OTHER): Payer: BLUE CROSS/BLUE SHIELD | Admitting: Emergency Medicine

## 2015-04-26 VITALS — BP 110/80 | HR 75 | Temp 98.3°F | Resp 16 | Ht 64.0 in | Wt 223.0 lb

## 2015-04-26 DIAGNOSIS — H8110 Benign paroxysmal vertigo, unspecified ear: Secondary | ICD-10-CM

## 2015-04-26 DIAGNOSIS — R42 Dizziness and giddiness: Secondary | ICD-10-CM | POA: Diagnosis not present

## 2015-04-26 LAB — COMPLETE METABOLIC PANEL WITH GFR
ALBUMIN: 4 g/dL (ref 3.6–5.1)
ALT: 13 U/L (ref 6–29)
AST: 13 U/L (ref 10–35)
Alkaline Phosphatase: 72 U/L (ref 33–115)
BILIRUBIN TOTAL: 1 mg/dL (ref 0.2–1.2)
BUN: 14 mg/dL (ref 7–25)
CO2: 24 mmol/L (ref 20–31)
Calcium: 8.9 mg/dL (ref 8.6–10.2)
Chloride: 102 mmol/L (ref 98–110)
Creat: 0.74 mg/dL (ref 0.50–1.10)
GFR, Est African American: >89 mL/min (ref >=60)
GLUCOSE: 81 mg/dL (ref 65–99)
Potassium: 4.2 mmol/L (ref 3.5–5.3)
SODIUM: 136 mmol/L (ref 135–146)
TOTAL PROTEIN: 7 g/dL (ref 6.1–8.1)

## 2015-04-26 LAB — POCT CBC
GRANULOCYTE PERCENT: 55.2 % (ref 37–80)
HCT, POC: 40 % (ref 37.7–47.9)
Hemoglobin: 13.8 g/dL (ref 12.2–16.2)
LYMPH, POC: 2.1 (ref 0.6–3.4)
MCH, POC: 31.5 pg — AB (ref 27–31.2)
MCHC: 34.4 g/dL (ref 31.8–35.4)
MCV: 91.5 fL (ref 80–97)
MID (CBC): 0.2 (ref 0–0.9)
MPV: 7.5 fL (ref 0–99.8)
PLATELET COUNT, POC: 243 10*3/uL (ref 142–424)
POC Granulocyte: 2.9 (ref 2–6.9)
POC LYMPH %: 40.6 % (ref 10–50)
POC MID %: 4.2 %M (ref 0–12)
RBC: 4.37 M/uL (ref 4.04–5.48)
RDW, POC: 13.2 %
WBC: 5.2 10*3/uL (ref 4.6–10.2)

## 2015-04-26 LAB — GLUCOSE, POCT (MANUAL RESULT ENTRY): POC GLUCOSE: 91 mg/dL (ref 70–99)

## 2015-04-26 MED ORDER — MECLIZINE HCL 25 MG PO TABS: ORAL_TABLET | ORAL | Status: DC

## 2015-04-26 NOTE — Progress Notes (Signed)
Patient ID: Breanna Thompson, female   DOB: [birth year], 44 y.o.   MRN: [id number]     By signing my name below, I, Zola Button, attest that this documentation has been prepared under the direction and in the presence of Arlyss Queen, MD.  Electronically Signed: Zola Button, Medical Scribe. 04/26/2015. 2:05 PM.   Chief Complaint:  Chief Complaint  Patient presents with  . Dizziness    since yesterday    HPI: Breanna Thompson is a 44 y.o. female who reports to [facility] today complaining of an episode of intermittent dizziness that started yesterday morning. The dizziness is described as a spinning sensation and only occurs with movement. She does not have any dizziness when sitting still. The dizziness has improved since yesterday, but it has not resolved. She reports having associated lightheadedness and left ear fullness. She thought she was starting to have a head cold, so she took some Tylenol Cold tablets yesterday. Patient denies chest pain, headache, weakness, diaphoresis, nausea, change in hearing, and feeling near-syncopal. She denies any known medical problems. She notes that her left ear often has problems with drainage and becoming clogged.  Past Medical History  Diagnosis Date  . Fibroid     hx of--removed with hyst.  . Viral meningitis 1990  . Thyroid nodule     both sides  . Allergy    Past Surgical History  Procedure Laterality Date  . Abdominal hysterectomy  10-29-03    TAH d/t fibroids--Dr. Margaretha Glassing  . Breast biopsy Right 2003    benign  . Augmentation mammaplasty Bilateral 05-02-2009    w/lift--[place]  . Biopsy thyroid      needle biopsy done on rt side   Social History   Social History  . Marital Status: Divorced    Spouse Name: N/A  . Number of Children: N/A  . Years of Education: N/A   Social History Main Topics  . Smoking status: Never Smoker   . Smokeless tobacco: None  . Alcohol Use: Yes     Comment: 1 a month  . Drug Use: No  . Sexual Activity:    Partners: Male    Birth Control/ Protection: Surgical     Comment: TAH--still has ovaries   Other Topics Concern  . None   Social History Narrative   Family History  Problem Relation Age of Onset  . Colon cancer Paternal Aunt     dx'd age 25-died age 65  . Colon cancer Paternal Aunt     dx'd 1 stage IV  . Hypertension Mother   . Diabetes Mother   . Diabetes Father   . Hypertension Father   . Colon cancer Paternal Aunt   . Hypertension Maternal Grandmother   . Cancer Maternal Grandmother   . Hypertension Maternal Grandfather   . Cancer Paternal Grandfather   . Hypertension Paternal Grandfather    No Known Allergies Prior to Admission medications   Medication Sig Start Date End Date Taking? Authorizing Provider  Betamethasone Valerate 0.12 % foam 2 (two) times a week. 06/14/13  Yes Historical Provider, MD  cholecalciferol (VITAMIN D) 1000 UNITS tablet Take 1,000 Units by mouth 2 (two) times daily.   Yes Historical Provider, MD  Clindamycin-Benzoyl Per, Refr, gel as needed. 06/15/13  Yes Historical Provider, MD  Multiple Vitamin (MULTIVITAMIN) capsule Take 1 capsule by mouth daily.   Yes Historical Provider, MD  vitamin C (ASCORBIC ACID) 500 MG tablet Take 500 mg by mouth daily.   Yes Historical Provider,  MD     ROS: The patient denies fevers, chills, night sweats, unintentional weight loss, chest pain, palpitations, wheezing, dyspnea on exertion, nausea, vomiting, abdominal pain, dysuria, hematuria, melena, numbness, weakness, or tingling.   All other systems have been reviewed and were otherwise negative with the exception of those mentioned in the HPI and as above.    PHYSICAL EXAM: Filed Vitals:   04/26/15 1350  BP: 110/80  Pulse: 75  Temp: 98.3 F (36.8 C)  Resp: 16   Body mass index is 38.26 kg/(m^2).   General: Alert, no acute distress HEENT:  Normocephalic, atraumatic, oropharynx patent. Eye: EOMI, [facility]. Disc margins sharp. Cardiovascular:  Regular rate  and rhythm, no rubs murmurs or gallops.  No Carotid bruits, radial pulse intact. No pedal edema.  Respiratory: Clear to auscultation bilaterally.  No wheezes, rales, or rhonchi.  No cyanosis, no use of accessory musculature Abdominal: No organomegaly, abdomen is soft and non-tender, positive bowel sounds.  No masses. Musculoskeletal: Gait intact. No edema, tenderness Skin: No rashes. Neurologic: Neurologic exam normal. Psychiatric: Patient acts appropriately throughout our interaction. Lymphatic: No cervical or submandibular lymphadenopathy    LABS: Results for orders placed or performed in visit on 04/26/15  POCT CBC  Result Value Ref Range   WBC 5.2 4.6 - 10.2 K/uL   Lymph, poc 2.1 0.6 - 3.4   POC LYMPH PERCENT 40.6 10 - 50 %L   MID (cbc) 0.2 0 - 0.9   POC MID % 4.2 0 - 12 %M   POC Granulocyte 2.9 2 - 6.9   Granulocyte percent 55.2 37 - 80 %G   RBC 4.37 4.04 - 5.48 M/uL   Hemoglobin 13.8 12.2 - 16.2 g/dL   HCT, POC 40.0 37.7 - 47.9 %   MCV 91.5 80 - 97 fL   MCH, POC 31.5 (A) 27 - 31.2 pg   MCHC 34.4 31.8 - 35.4 g/dL   RDW, POC 13.2 %   Platelet Count, POC 243 142 - 424 K/uL   MPV 7.5 0 - 99.8 fL  POCT glucose (manual entry)  Result Value Ref Range   POC Glucose 91 70 - 99 mg/dl     EKG/XRAY:   Primary read interpreted by Dr. Everlene Farrier at [facility]. Normal sinus rhythm no acute changes   ASSESSMENT/PLAN: Patient was borderline orthostatic with her pulse. I encouraged her to drink good quantities of fluids. I suspect she has a low-grade benign positional vertigo and was given a prescription for Antivert. She was also given instructions to be cautious regarding driving turning or climbing.I personally performed the services described in this documentation, which was scribed in my presence. The recorded information has been reviewed and is accurate.   Gross sideeffects, risk and benefits, and alternatives of medications d/w patient. Patient is aware that all medications have  potential sideeffects and we are unable to predict every sideeffect or drug-drug interaction that may occur.  Arlyss Queen MD 04/26/2015 2:05 PM

## 2015-04-26 NOTE — Patient Instructions (Signed)

## 2015-04-27 LAB — TSH: TSH: 0.674 u[IU]/mL (ref 0.350–4.500)

## 2015-07-04 ENCOUNTER — Ambulatory Visit: Payer: BLUE CROSS/BLUE SHIELD | Admitting: Nurse Practitioner

## 2015-08-07 ENCOUNTER — Other Ambulatory Visit: Payer: Self-pay

## 2015-08-07 DIAGNOSIS — Z1231 Encounter for screening mammogram for malignant neoplasm of breast: Secondary | ICD-10-CM

## 2015-09-04 ENCOUNTER — Ambulatory Visit
Admission: RE | Admit: 2015-09-04 | Discharge: 2015-09-04 | Disposition: A | Discharge status: Home / Self Care | Payer: BLUE CROSS/BLUE SHIELD | Source: Ambulatory Visit

## 2015-09-04 ENCOUNTER — Other Ambulatory Visit: Payer: Self-pay

## 2015-09-04 DIAGNOSIS — Z1231 Encounter for screening mammogram for malignant neoplasm of breast: Secondary | ICD-10-CM

## 2015-09-09 ENCOUNTER — Ambulatory Visit (INDEPENDENT_AMBULATORY_CARE_PROVIDER_SITE_OTHER): Payer: BLUE CROSS/BLUE SHIELD | Admitting: Nurse Practitioner

## 2015-09-09 ENCOUNTER — Encounter: Payer: Self-pay | Admitting: Nurse Practitioner

## 2015-09-09 VITALS — BP 108/66 | HR 84 | Resp 16 | Ht 63.5 in | Wt 227.0 lb

## 2015-09-09 DIAGNOSIS — Z01419 Encounter for gynecological examination (general) (routine) without abnormal findings: Secondary | ICD-10-CM

## 2015-09-09 DIAGNOSIS — Z1211 Encounter for screening for malignant neoplasm of colon: Secondary | ICD-10-CM | POA: Diagnosis not present

## 2015-09-09 DIAGNOSIS — Z0000 Encounter for general adult medical examination without abnormal findings: Secondary | ICD-10-CM

## 2015-09-09 LAB — POCT URINALYSIS DIPSTICK
Bilirubin, UA: NEGATIVE
GLUCOSE UA: NEGATIVE
Ketones, UA: NEGATIVE
Leukocytes, UA: NEGATIVE
Nitrite, UA: NEGATIVE
PH UA: 5
Protein, UA: NEGATIVE
RBC UA: NEGATIVE
UROBILINOGEN UA: NEGATIVE

## 2015-09-09 NOTE — Patient Instructions (Signed)

## 2015-09-09 NOTE — Progress Notes (Signed)
45 y.o. [id number] Divorced  African American Fe here for annual exam.  She did have another thyroid US which is stable compared to 2014 exam. Same partner for 5 yrs.    Patient's last menstrual period was 10/20/2003.          Sexually active: Yes.    The current method of family planning is status post hysterectomy.    Exercising: No.  The patient does not participate in regular exercise at present. Smoker:  no  Health Maintenance: Pap:  09/17/03 Neg.  MMG:  09/05/15 BIRADS1:Neg Colonoscopy:  04/17/11 due 2019 due to FAM Hx.  TDaP:  02/2008  HIV: Done here, years ago. Labs: PCP  Urine: negative   reports that she has never smoked. She has never used smokeless tobacco. She reports that she does not drink alcohol or use illicit drugs.  Past Medical History  Diagnosis Date  . Fibroid     hx of--removed with hyst.  . Viral meningitis 1990  . Thyroid nodule     both sides  . Allergy     Past Surgical History  Procedure Laterality Date  . Abdominal hysterectomy  10-29-03    TAH d/t fibroids--Dr. Margaretha Glassing  . Breast biopsy Right 2003    benign  . Augmentation mammaplasty Bilateral 05-02-2009    w/lift--[place]  . Biopsy thyroid      needle biopsy done on rt side    Current Outpatient Prescriptions  Medication Sig Dispense Refill  . Betamethasone Valerate 0.12 % foam 2 (two) times a week.    . calcium carbonate (OS-CAL) 1250 (500 Ca) MG chewable tablet Chew 1 tablet by mouth daily.    . cholecalciferol (VITAMIN D) 1000 UNITS tablet Take 1,000 Units by mouth 2 (two) times daily.    . Clindamycin-Benzoyl Per, Refr, gel as needed.    . loratadine (CLARITIN) 10 MG tablet Take 10 mg by mouth daily.    . Multiple Vitamin (MULTIVITAMIN) capsule Take 1 capsule by mouth daily.    . vitamin C (ASCORBIC ACID) 500 MG tablet Take 500 mg by mouth daily.     No current facility-administered medications for this visit.    Family History  Problem Relation Age of Onset  . Colon cancer Paternal  Aunt     dx'd age 68-died age 50  . Colon cancer Paternal Aunt     dx'd 28 stage IV  . Hypertension Mother   . Diabetes Mother   . Diabetes Father   . Hypertension Father   . Colon cancer Paternal Aunt     dx'd late 26's, died age 60  . Hypertension Maternal Grandmother   . Cancer Maternal Grandmother   . Hypertension Maternal Grandfather   . Lung cancer Paternal Grandfather     smoker  . Hypertension Paternal Grandfather   . Kidney Stones Sister     ROS:  Pertinent items are noted in HPI.  Otherwise, a comprehensive ROS was negative.  Exam:   BP 108/66 mmHg  Pulse 84  Resp 16  Ht 5' 3.5" (1.613 m)  Wt 227 lb (102.967 kg)  BMI 39.58 kg/m2  LMP 10/20/2003 Height: 5' 3.5" (161.3 cm) Ht Readings from Last 3 Encounters:  09/09/15 5' 3.5" (1.613 m)  04/26/15 5\' 4"  (1.626 m)  06/27/14 5\' 4"  (1.626 m)    General appearance: alert, cooperative and appears stated age Head: Normocephalic, without obvious abnormality, atraumatic Neck: no adenopathy, supple, symmetrical, trachea midline and thyroid only slightly enlarged to inspection and palpation  Lungs: clear to auscultation bilaterally Breasts: normal appearance, no masses or tenderness, surgical changes from breast reduction and augmentation Heart: regular rate and rhythm Abdomen: soft, non-tender; no masses,  no organomegaly Extremities: extremities normal, atraumatic, no cyanosis or edema Skin: Skin color, texture, turgor normal. No rashes or lesions Lymph nodes: Cervical, supraclavicular, and axillary nodes normal. No abnormal inguinal nodes palpated Neurologic: Grossly normal   Pelvic: External genitalia:  no lesions              Urethra:  normal appearing urethra with no masses, tenderness or lesions              Bartholin's and Skene's: normal                 Vagina: normal appearing vagina with normal color and discharge, no lesions              Cervix: absent              Pap taken: No. Bimanual Exam:  Uterus:   uterus absent              Adnexa: no mass, fullness, tenderness               Rectovaginal: Confirms               Anus:  normal sphincter tone, no lesions  Chaperone present: no  A:  Well Woman with normal exam  Thyroid nodules followed by PCP - stable [place] S/P TAH 10/29/2003 for fibroids S/P Breat augmentation with lift 05/02/09 [place]: colon cancer - 3 PA   P:   Reviewed health and wellness pertinent to exam  Pap smear as above  Mammogram is due 08/2016  IFOB is given  Counseled on breast self exam, mammography screening, adequate intake of calcium and vitamin D, diet and exercise, Kegel's exercises return annually or prn  An After Visit Summary was printed and given to the patient.

## 2015-09-12 NOTE — Progress Notes (Signed)
Encounter reviewed by Dr. Brook Amundson C. Silva.  

## 2015-12-02 LAB — FECAL OCCULT BLOOD, IMMUNOCHEMICAL: IMMUNOLOGICAL FECAL OCCULT BLOOD TEST: NEGATIVE

## 2015-12-02 NOTE — Addendum Note (Signed)
Addended by: Remigio Eisenmenger on: 12/02/2015 01:22 PM   Modules accepted: Orders

## 2016-04-07 DIAGNOSIS — Z13220 Encounter for screening for lipoid disorders: Secondary | ICD-10-CM | POA: Diagnosis not present

## 2016-04-07 DIAGNOSIS — E042 Nontoxic multinodular goiter: Secondary | ICD-10-CM | POA: Diagnosis not present

## 2016-04-07 DIAGNOSIS — Z0001 Encounter for general adult medical examination with abnormal findings: Secondary | ICD-10-CM | POA: Diagnosis not present

## 2016-04-07 DIAGNOSIS — E559 Vitamin D deficiency, unspecified: Secondary | ICD-10-CM | POA: Diagnosis not present

## 2016-08-12 ENCOUNTER — Other Ambulatory Visit: Payer: Self-pay | Admitting: Obstetrics and Gynecology

## 2016-08-12 DIAGNOSIS — Z1231 Encounter for screening mammogram for malignant neoplasm of breast: Secondary | ICD-10-CM

## 2016-08-16 ENCOUNTER — Other Ambulatory Visit: Payer: Self-pay | Admitting: Internal Medicine

## 2016-08-16 DIAGNOSIS — E042 Nontoxic multinodular goiter: Secondary | ICD-10-CM

## 2016-08-23 ENCOUNTER — Ambulatory Visit
Admission: RE | Admit: 2016-08-23 | Discharge: 2016-08-23 | Disposition: A | Discharge status: Home / Self Care | Payer: BLUE CROSS/BLUE SHIELD | Source: Ambulatory Visit | Attending: Internal Medicine | Admitting: Internal Medicine

## 2016-08-23 DIAGNOSIS — E042 Nontoxic multinodular goiter: Secondary | ICD-10-CM

## 2016-08-23 DIAGNOSIS — E049 Nontoxic goiter, unspecified: Secondary | ICD-10-CM | POA: Diagnosis not present

## 2016-09-08 DIAGNOSIS — E049 Nontoxic goiter, unspecified: Secondary | ICD-10-CM | POA: Diagnosis not present

## 2016-09-09 ENCOUNTER — Other Ambulatory Visit: Payer: Self-pay | Admitting: Obstetrics and Gynecology

## 2016-09-09 ENCOUNTER — Ambulatory Visit
Admission: RE | Admit: 2016-09-09 | Discharge: 2016-09-09 | Disposition: A | Discharge status: Home / Self Care | Payer: BLUE CROSS/BLUE SHIELD | Source: Ambulatory Visit | Attending: Obstetrics and Gynecology | Admitting: Obstetrics and Gynecology

## 2016-09-09 DIAGNOSIS — Z1231 Encounter for screening mammogram for malignant neoplasm of breast: Secondary | ICD-10-CM

## 2016-09-14 ENCOUNTER — Ambulatory Visit (INDEPENDENT_AMBULATORY_CARE_PROVIDER_SITE_OTHER): Payer: BLUE CROSS/BLUE SHIELD | Admitting: Nurse Practitioner

## 2016-09-14 ENCOUNTER — Encounter: Payer: Self-pay | Admitting: Nurse Practitioner

## 2016-09-14 VITALS — BP 110/72 | HR 80 | Ht 64.0 in | Wt 224.0 lb

## 2016-09-14 DIAGNOSIS — Z01411 Encounter for gynecological examination (general) (routine) with abnormal findings: Secondary | ICD-10-CM | POA: Diagnosis not present

## 2016-09-14 DIAGNOSIS — Z0000 Encounter for general adult medical examination without abnormal findings: Secondary | ICD-10-CM | POA: Diagnosis not present

## 2016-09-14 DIAGNOSIS — Z1211 Encounter for screening for malignant neoplasm of colon: Secondary | ICD-10-CM | POA: Diagnosis not present

## 2016-09-14 NOTE — Patient Instructions (Addendum)

## 2016-09-14 NOTE — Progress Notes (Signed)
Patient ID: COTY STUDENT, female   DOB: 07-Jan-1971, 46 y.o.   MRN: [id number]  46 y.o. [id number] Divorced  African American Fe here for annual exam. Saw Endo last week.  Will follow up again for thyroid in 1 yr. No new health since last year.  Same partner 6 yrs.   Dr. Delrae Rend = THYROID US 08/23/16 -  IMPRESSION:  Enlarged and heterogeneous thyroid tissue. No discrete thyroid nodules. Findings are similar to the previous examination.  Patient's last menstrual period was 10/20/2003 (exact date).          Sexually active: Yes.    The current method of family planning is status post hysterectomy.    Exercising: No.  The patient does not participate in regular exercise at present. Smoker:  no  Health Maintenance: Pap: 09/17/03, Negative  Hysterectomy, no other history available History of Abnormal Pap: no MMG: 09/09/16, 3D-yes, Density Category B, Bi-Rads 1:  Negative Self Breast exams: yes Colonoscopy: 04/17/11, repeat in 2019, early due to family history TDaP: 02/15/08 HIV: 04/04/09 in [place]: PCP takes care of all screening labs    reports that she has never smoked. She has never used smokeless tobacco. She reports that she does not drink alcohol or use drugs.  Past Medical History:  Diagnosis Date  . Allergy   . Fibroid 10/29/2003   hx of--removed with hyst.  . Thyroid nodule 07/26/2008   both sides,  right thyroid biopsy X 2 07/2008 & 06/2013 benign  . Viral meningitis 1990    Past Surgical History:  Procedure Laterality Date  . ABDOMINAL HYSTERECTOMY  10-29-03   TAH d/t fibroids--Dr. Margaretha Glassing  . AUGMENTATION MAMMAPLASTY Bilateral 05-02-2009   w/lift--[place]  . BIOPSY THYROID Right 08/14/2008 & 07/11/2013   needle biopsy done on rt side - benign  . BREAST BIOPSY Right 2003   benign    Current Outpatient Prescriptions  Medication Sig Dispense Refill  . Betamethasone Valerate 0.12 % foam 2 (two) times a week.    . calcium carbonate (OS-CAL) 1250 (500 Ca)  MG chewable tablet Chew 1 tablet by mouth daily.    . cholecalciferol (VITAMIN D) 1000 UNITS tablet Take 1,000 Units by mouth 2 (two) times daily.    . Clindamycin-Benzoyl Per, Refr, gel as needed.    . loratadine (CLARITIN) 10 MG tablet Take 10 mg by mouth daily.    . Multiple Vitamin (MULTIVITAMIN) capsule Take 1 capsule by mouth daily.    . vitamin C (ASCORBIC ACID) 500 MG tablet Take 500 mg by mouth daily.     No current facility-administered medications for this visit.     Family History  Problem Relation Age of Onset  . Hypertension Mother   . Diabetes Mother   . Diabetes Father   . Hypertension Father   . Hypertension Maternal Grandmother   . Cancer Maternal Grandmother   . Hypertension Maternal Grandfather   . Lung cancer Paternal Grandfather     smoker  . Hypertension Paternal Grandfather   . Kidney Stones Sister   . Colon cancer Paternal Aunt     dx'd age 95-died age 65  . Colon cancer Paternal Aunt     dx'd 60 stage IV  . Colon cancer Paternal Aunt     dx'd late 63's, died age 21  . Breast cancer Maternal Aunt     ROS:  Pertinent items are noted in HPI.  Otherwise, a comprehensive ROS was negative.  Exam:   BP 110/72 (  BP Location: Right Arm, Patient Position: Sitting, Cuff Size: Large)   Pulse 80   Ht 5\' 4"  (1.626 m)   Wt 224 lb (101.6 kg)   LMP 10/20/2003 (Exact Date)   BMI 38.45 kg/m  Height: 5\' 4"  (162.6 cm) Ht Readings from Last 3 Encounters:  09/14/16 5\' 4"  (1.626 m)  09/09/15 5' 3.5" (1.613 m)  04/26/15 5\' 4"  (1.626 m)    General appearance: alert, cooperative and appears stated age Head: Normocephalic, without obvious abnormality, atraumatic Neck: no adenopathy, supple, symmetrical, trachea midline and thyroid normal to inspection and palpation Lungs: clear to auscultation bilaterally Breasts: normal appearance, no masses or tenderness Heart: regular rate and rhythm Abdomen: soft, non-tender; no masses,  no organomegaly Extremities: extremities  normal, atraumatic, no cyanosis or edema Skin: Skin color, texture, turgor normal. No rashes or lesions Lymph nodes: Cervical, supraclavicular, and axillary nodes normal. No abnormal inguinal nodes palpated Neurologic: Grossly normal   Pelvic: External genitalia:  no lesions              Urethra:  normal appearing urethra with no masses, tenderness or lesions              Bartholin's and Skene's: normal                 Vagina: normal appearing vagina with normal color and discharge, no lesions              Cervix: absent              Pap taken: No. Bimanual Exam:  Uterus:  uterus absent              Adnexa: no mass, fullness, tenderness               Rectovaginal: Confirms               Anus:  normal sphincter tone, no lesions  Chaperone present: yes  A:  Well Woman with normal exam  Thyroid nodules followed by Endo - stable [place] 08/2016 S/P TAH 10/29/2003 for fibroids S/P Breat augmentation with lift 05/02/09 [place]: colon cancer - 3 PA   P:   Reviewed health and wellness pertinent to exam  Pap smear: no  Mammogram is due 08/2017  IFOB is given today  Will check Vit D  She has thought about genetic test in the past and does not think it will change how she is being screened now.  Counseled on breast self exam, mammography screening, adequate intake of calcium and vitamin D, diet and exercise, Kegel's exercises return annually or prn  An After Visit Summary was printed and given to the patient.

## 2016-09-15 LAB — VITAMIN D 25 HYDROXY (VIT D DEFICIENCY, FRACTURES): Vit D, 25-Hydroxy: 35 ng/mL (ref 30–100)

## 2016-09-15 NOTE — Progress Notes (Signed)
Encounter reviewed by Dr. Brook Amundson C. Silva.  

## 2016-09-24 DIAGNOSIS — L218 Other seborrheic dermatitis: Secondary | ICD-10-CM | POA: Diagnosis not present

## 2016-09-24 DIAGNOSIS — L700 Acne vulgaris: Secondary | ICD-10-CM | POA: Diagnosis not present

## 2016-11-18 LAB — FECAL OCCULT BLOOD, IMMUNOCHEMICAL: IFOBT: NEGATIVE

## 2016-11-18 NOTE — Addendum Note (Signed)
Addended by: Polly Cobia on: 11/18/2016 02:33 PM   Modules accepted: Orders

## 2016-12-14 ENCOUNTER — Telehealth: Payer: Self-pay | Admitting: Obstetrics & Gynecology

## 2016-12-14 NOTE — Telephone Encounter (Signed)
Mail box is full. Mail letter regarding cancelled appointment.

## 2017-01-11 DIAGNOSIS — M2242 Chondromalacia patellae, left knee: Secondary | ICD-10-CM | POA: Diagnosis not present

## 2017-01-11 DIAGNOSIS — M7062 Trochanteric bursitis, left hip: Secondary | ICD-10-CM | POA: Diagnosis not present

## 2017-04-29 DIAGNOSIS — Z0000 Encounter for general adult medical examination without abnormal findings: Secondary | ICD-10-CM | POA: Diagnosis not present

## 2017-04-29 DIAGNOSIS — Z23 Encounter for immunization: Secondary | ICD-10-CM | POA: Diagnosis not present

## 2017-04-29 DIAGNOSIS — E042 Nontoxic multinodular goiter: Secondary | ICD-10-CM | POA: Diagnosis not present

## 2017-08-18 ENCOUNTER — Other Ambulatory Visit: Payer: Self-pay | Admitting: Family Medicine

## 2017-08-18 DIAGNOSIS — Z1231 Encounter for screening mammogram for malignant neoplasm of breast: Secondary | ICD-10-CM

## 2017-08-23 DIAGNOSIS — L810 Postinflammatory hyperpigmentation: Secondary | ICD-10-CM | POA: Diagnosis not present

## 2017-08-23 DIAGNOSIS — L700 Acne vulgaris: Secondary | ICD-10-CM | POA: Diagnosis not present

## 2017-08-23 DIAGNOSIS — L218 Other seborrheic dermatitis: Secondary | ICD-10-CM | POA: Diagnosis not present

## 2017-09-16 ENCOUNTER — Encounter: Payer: Self-pay | Admitting: Certified Nurse Midwife

## 2017-09-16 ENCOUNTER — Ambulatory Visit (INDEPENDENT_AMBULATORY_CARE_PROVIDER_SITE_OTHER): Payer: BLUE CROSS/BLUE SHIELD | Admitting: Certified Nurse Midwife

## 2017-09-16 ENCOUNTER — Ambulatory Visit: Payer: BLUE CROSS/BLUE SHIELD | Admitting: Nurse Practitioner

## 2017-09-16 ENCOUNTER — Other Ambulatory Visit: Payer: Self-pay

## 2017-09-16 VITALS — BP 106/70 | HR 70 | Resp 16 | Ht 63.75 in | Wt 223.0 lb

## 2017-09-16 DIAGNOSIS — Z800 Family history of malignant neoplasm of digestive organs: Secondary | ICD-10-CM | POA: Diagnosis not present

## 2017-09-16 DIAGNOSIS — Z01419 Encounter for gynecological examination (general) (routine) without abnormal findings: Secondary | ICD-10-CM | POA: Diagnosis not present

## 2017-09-16 NOTE — Patient Instructions (Signed)

## 2017-09-16 NOTE — Progress Notes (Signed)
47 y.o. [id number] Divorced  African American Fe here for annual exam. No symptoms of menopause at this time. Sees PCP Dr. Stephanie Acre once yearly with labs and aex. Working on weight loss and not gain, with healthy diet. Travels for job and will be going to [place] soon and concerns with Measles outbreak in the different areas. She does not want to expose her pregnant daughter. Patient had all her immunizations and is up to date. Declines any vaginal concerns today or STD concerns. No other health issues today.  Patient's last menstrual period was 10/20/2003 (exact date).          Sexually active: Yes.    The current method of family planning is status post hysterectomy.    Exercising: No.  exercise Smoker:  no  Health Maintenance: Pap:  09-17-03 neg History of Abnormal Pap: no MMG:  09-09-16 category b density birads 1:neg Self Breast exams: occ Colonoscopy:  2012 repeat every 7years BMD:   none TDaP:  2018 Shingles: no Pneumonia: no Hep C and HIV: HIV neg per patient Labs: with PCP   reports that she has never smoked. She has never used smokeless tobacco. She reports that she does not drink alcohol or use drugs.  Past Medical History:  Diagnosis Date  . Allergy   . Fibroid 10/29/2003   hx of--removed with hyst.  . Thyroid nodule 07/26/2008   both sides,  right thyroid biopsy X 2 07/2008 & 06/2013 benign  . Viral meningitis 1990    Past Surgical History:  Procedure Laterality Date  . ABDOMINAL HYSTERECTOMY  10-29-03   TAH d/t fibroids--Dr. Margaretha Glassing  . AUGMENTATION MAMMAPLASTY Bilateral 05-02-2009   w/lift--Saline  . BIOPSY THYROID Right 08/14/2008 & 07/11/2013   needle biopsy done on rt side - benign  . BREAST BIOPSY Right 2003   benign    Current Outpatient Medications  Medication Sig Dispense Refill  . Betamethasone Valerate 0.12 % foam 2 (two) times a week.    . calcium carbonate (OS-CAL) 1250 (500 Ca) MG chewable tablet Chew 1 tablet by mouth daily.    . cholecalciferol  (VITAMIN D) 1000 UNITS tablet Take 1,000 Units by mouth 2 (two) times daily.    . Clindamycin-Benzoyl Per, Refr, gel as needed.    . hydroquinone 4 % cream APP AA ON RIGHT LEG BID FOR HYPERPIGMENTATION  0  . loratadine (CLARITIN) 10 MG tablet Take 10 mg by mouth as needed.     . Multiple Vitamin (MULTIVITAMIN) capsule Take 1 capsule by mouth daily.    . vitamin C (ASCORBIC ACID) 500 MG tablet Take 500 mg by mouth daily.     No current facility-administered medications for this visit.     Family History  Problem Relation Age of Onset  . Hypertension Mother   . Diabetes Mother   . Diabetes Father   . Hypertension Father   . Hypertension Maternal Grandmother   . Hypertension Maternal Grandfather   . Prostate cancer Maternal Grandfather   . Lung cancer Paternal Grandfather        smoker  . Hypertension Paternal Grandfather   . Kidney Stones Sister   . Hypertension Paternal Grandmother   . Colon cancer Paternal Aunt        dx'd age 31-died age 96  . Colon cancer Paternal Aunt        dx'd 23 stage IV  . Colon cancer Paternal Aunt        dx'd late 77's, died age 5  .  Breast cancer Maternal Aunt 45       bilateral mastectomy, no BRCA testing    ROS:  Pertinent items are noted in HPI.  Otherwise, a comprehensive ROS was negative.  Exam:   BP 106/70   Pulse 70   Resp 16   Ht 5' 3.75" (1.619 m)   Wt 223 lb (101.2 kg)   LMP 10/20/2003 (Exact Date)   BMI 38.58 kg/m  Height: 5' 3.75" (161.9 cm) Ht Readings from Last 3 Encounters:  09/16/17 5' 3.75" (1.619 m)  09/14/16 5' 4" (1.626 m)  09/09/15 5' 3.5" (1.613 m)    General appearance: alert, cooperative and appears stated age Head: Normocephalic, without obvious abnormality, atraumatic Neck: no adenopathy, supple, symmetrical, trachea midline and thyroid enlarged bilateral (known with nodules) Lungs: clear to auscultation bilaterally Breasts: normal appearance, no masses or tenderness, No nipple retraction or dimpling, No  nipple discharge or bleeding, No axillary or supraclavicular adenopathy, surgically lift scars noted bilateral with implants Heart: regular rate and rhythm Abdomen: soft, non-tender; no masses,  no organomegaly Extremities: extremities normal, atraumatic, no cyanosis or edema Skin: Skin color, texture, turgor normal. No rashes or lesions Lymph nodes: Cervical, supraclavicular, and axillary nodes normal. No abnormal inguinal nodes palpated Neurologic: Grossly normal   Pelvic: External genitalia:  no lesions              Urethra:  normal appearing urethra with no masses, tenderness or lesions              Bartholin's and Skene's: normal                 Vagina: normal appearing vagina with normal color and discharge, no lesions              Cervix: absent              Pap taken: No. Bimanual Exam:  Uterus:  uterus absent              Adnexa: normal adnexa and no mass, fullness, tenderness               Rectovaginal: Confirms               Anus:  normal sphincter tone, no lesions  Chaperone present: yes  A:  Well Woman with normal exam  S/P TAH for fibroid/bleeding, ovaries retained  Known thyroid nodules with MD management  Strong family history of colon cancer, colonoscopy up to date  Obesity  Measles concerns with her exposure with traveling.  P:   Reviewed health and wellness pertinent to exam  Continue to follow up with PCP as indicated  Notify if any bowel changes due to history  Continue to work of weight loss for better health  Reviewed UTD and CDC information and encouraged patient to follow CDC guidelines if concerns or check travel clinic for recommendations.  Pap smear: no   counseled on breast self exam, mammography screening, adequate intake of calcium and vitamin D, diet and exercise  return annually or prn  An After Visit Summary was printed and given to the patient.

## 2017-09-19 ENCOUNTER — Ambulatory Visit
Admission: RE | Admit: 2017-09-19 | Discharge: 2017-09-19 | Disposition: A | Discharge status: Home / Self Care | Payer: BLUE CROSS/BLUE SHIELD | Source: Ambulatory Visit | Attending: Family Medicine | Admitting: Family Medicine

## 2017-09-19 DIAGNOSIS — Z1231 Encounter for screening mammogram for malignant neoplasm of breast: Secondary | ICD-10-CM | POA: Diagnosis not present

## 2017-09-22 DIAGNOSIS — E049 Nontoxic goiter, unspecified: Secondary | ICD-10-CM | POA: Diagnosis not present

## 2017-09-22 DIAGNOSIS — R760 Raised antibody titer: Secondary | ICD-10-CM | POA: Diagnosis not present

## 2017-09-22 DIAGNOSIS — Z0184 Encounter for antibody response examination: Secondary | ICD-10-CM | POA: Diagnosis not present

## 2018-03-06 DIAGNOSIS — G5702 Lesion of sciatic nerve, left lower limb: Secondary | ICD-10-CM | POA: Diagnosis not present

## 2018-03-06 DIAGNOSIS — Z23 Encounter for immunization: Secondary | ICD-10-CM | POA: Diagnosis not present

## 2018-05-02 DIAGNOSIS — Z800 Family history of malignant neoplasm of digestive organs: Secondary | ICD-10-CM | POA: Diagnosis not present

## 2018-05-02 DIAGNOSIS — Z1211 Encounter for screening for malignant neoplasm of colon: Secondary | ICD-10-CM | POA: Diagnosis not present

## 2018-05-02 DIAGNOSIS — Z8371 Family history of colonic polyps: Secondary | ICD-10-CM | POA: Diagnosis not present

## 2018-05-08 ENCOUNTER — Other Ambulatory Visit: Payer: Self-pay | Admitting: Gastroenterology

## 2018-05-08 DIAGNOSIS — R7989 Other specified abnormal findings of blood chemistry: Secondary | ICD-10-CM

## 2018-05-08 DIAGNOSIS — R945 Abnormal results of liver function studies: Principal | ICD-10-CM

## 2018-05-15 ENCOUNTER — Ambulatory Visit
Admission: RE | Admit: 2018-05-15 | Discharge: 2018-05-15 | Disposition: A | Discharge status: Home / Self Care | Payer: BLUE CROSS/BLUE SHIELD | Source: Ambulatory Visit | Attending: Gastroenterology | Admitting: Gastroenterology

## 2018-05-15 DIAGNOSIS — R7989 Other specified abnormal findings of blood chemistry: Secondary | ICD-10-CM | POA: Diagnosis not present

## 2018-05-29 ENCOUNTER — Encounter: Payer: Self-pay | Admitting: Certified Nurse Midwife

## 2018-05-29 DIAGNOSIS — Z8371 Family history of colonic polyps: Secondary | ICD-10-CM | POA: Diagnosis not present

## 2018-05-29 DIAGNOSIS — Z1211 Encounter for screening for malignant neoplasm of colon: Secondary | ICD-10-CM | POA: Diagnosis not present

## 2018-05-29 DIAGNOSIS — Z800 Family history of malignant neoplasm of digestive organs: Secondary | ICD-10-CM | POA: Diagnosis not present

## 2018-06-06 DIAGNOSIS — Z0000 Encounter for general adult medical examination without abnormal findings: Secondary | ICD-10-CM | POA: Diagnosis not present

## 2018-06-06 DIAGNOSIS — E042 Nontoxic multinodular goiter: Secondary | ICD-10-CM | POA: Diagnosis not present

## 2018-06-15 DIAGNOSIS — Z713 Dietary counseling and surveillance: Secondary | ICD-10-CM | POA: Diagnosis not present

## 2018-06-29 DIAGNOSIS — L218 Other seborrheic dermatitis: Secondary | ICD-10-CM | POA: Diagnosis not present

## 2018-06-29 DIAGNOSIS — L700 Acne vulgaris: Secondary | ICD-10-CM | POA: Diagnosis not present

## 2018-07-10 DIAGNOSIS — Z713 Dietary counseling and surveillance: Secondary | ICD-10-CM | POA: Diagnosis not present

## 2018-07-31 ENCOUNTER — Encounter: Payer: Self-pay | Admitting: Certified Nurse Midwife

## 2018-09-26 ENCOUNTER — Ambulatory Visit: Payer: BLUE CROSS/BLUE SHIELD | Admitting: Certified Nurse Midwife

## 2018-11-22 ENCOUNTER — Other Ambulatory Visit: Payer: Self-pay | Admitting: Family Medicine

## 2018-11-22 DIAGNOSIS — Z1231 Encounter for screening mammogram for malignant neoplasm of breast: Secondary | ICD-10-CM

## 2019-01-09 ENCOUNTER — Ambulatory Visit
Admission: RE | Admit: 2019-01-09 | Discharge: 2019-01-09 | Disposition: A | Discharge status: Home / Self Care | Payer: BC Managed Care – PPO | Source: Ambulatory Visit | Attending: Family Medicine | Admitting: Family Medicine

## 2019-01-09 ENCOUNTER — Other Ambulatory Visit: Payer: Self-pay

## 2019-01-09 ENCOUNTER — Ambulatory Visit: Payer: BLUE CROSS/BLUE SHIELD | Admitting: Certified Nurse Midwife

## 2019-01-09 DIAGNOSIS — Z1231 Encounter for screening mammogram for malignant neoplasm of breast: Secondary | ICD-10-CM | POA: Diagnosis not present

## 2019-01-10 ENCOUNTER — Other Ambulatory Visit: Payer: Self-pay

## 2019-01-12 ENCOUNTER — Other Ambulatory Visit: Payer: Self-pay | Admitting: Family Medicine

## 2019-01-12 ENCOUNTER — Encounter: Payer: Self-pay | Admitting: Certified Nurse Midwife

## 2019-01-12 ENCOUNTER — Other Ambulatory Visit: Payer: Self-pay

## 2019-01-12 ENCOUNTER — Ambulatory Visit (INDEPENDENT_AMBULATORY_CARE_PROVIDER_SITE_OTHER): Payer: BC Managed Care – PPO | Admitting: Certified Nurse Midwife

## 2019-01-12 VITALS — BP 118/76 | HR 70 | Temp 97.4°F | Resp 16 | Ht 63.75 in | Wt 227.0 lb

## 2019-01-12 DIAGNOSIS — R928 Other abnormal and inconclusive findings on diagnostic imaging of breast: Secondary | ICD-10-CM

## 2019-01-12 DIAGNOSIS — Z01419 Encounter for gynecological examination (general) (routine) without abnormal findings: Secondary | ICD-10-CM

## 2019-01-12 NOTE — Patient Instructions (Signed)

## 2019-01-12 NOTE — Progress Notes (Signed)
48 y.o. [id number] Divorced  African American Fe here for annual exam.  No vaginal bleeding, some vaginal dryness. Sees PCP for aex and labs, medication management. Still working from home, but ready to go to the office. Mammogram this week with call back on right.Patient has not noted any changes. Implants feel intact. Had colonoscopy and was negative. No other health issues today.  Patient's last menstrual period was 10/20/2003 (exact date).          Sexually active: Yes.    The current method of family planning is status post hysterectomy.    Exercising: Yes.    walking Smoker:  no  Review of Systems  Constitutional: Negative.   HENT: Negative.   Eyes: Negative.   Respiratory: Negative.   Cardiovascular: Negative.   Gastrointestinal: Negative.   Genitourinary: Negative.   Musculoskeletal: Negative.   Skin: Negative.   Neurological: Negative.   Endo/Heme/Allergies: Negative.   Psychiatric/Behavioral: Negative.     Health Maintenance: Pap:  09-17-03 neg History of Abnormal Pap: no MMG:  01-09-2019 needs additional imaging Self Breast exams: no Colonoscopy:  2020 f/u 4yr BMD:   none TDaP:  2018 Shingles: not done Pneumonia: not done Hep C and HIV: neg per patient Labs: yes   reports that she has never smoked. She has never used smokeless tobacco. She reports that she does not drink alcohol or use drugs.  Past Medical History:  Diagnosis Date  . Allergy   . Fibroid 10/29/2003   hx of--removed with hyst.  . Thyroid nodule 07/26/2008   both sides,  right thyroid biopsy X 2 07/2008 & 06/2013 benign  . Viral meningitis 1990    Past Surgical History:  Procedure Laterality Date  . ABDOMINAL HYSTERECTOMY  10-29-03   TAH d/t fibroids--Dr. LMargaretha Glassing . AUGMENTATION MAMMAPLASTY Bilateral 05-02-2009   w/lift--Saline  . BIOPSY THYROID Right 08/14/2008 & 07/11/2013   needle biopsy done on rt side - benign  . BREAST BIOPSY Right 2003   benign    Current Outpatient Medications   Medication Sig Dispense Refill  . Betamethasone Valerate 0.12 % foam 2 (two) times a week.    . calcium carbonate (OS-CAL) 1250 (500 Ca) MG chewable tablet Chew 1 tablet by mouth daily.    . cholecalciferol (VITAMIN D) 1000 UNITS tablet Take 1,000 Units by mouth 2 (two) times daily.    . Clindamycin-Benzoyl Per, Refr, gel as needed.    . hydroquinone 4 % cream APP AA ON RIGHT LEG BID FOR HYPERPIGMENTATION  0  . loratadine (CLARITIN) 10 MG tablet Take 10 mg by mouth as needed.     . Multiple Vitamin (MULTIVITAMIN) capsule Take 1 capsule by mouth daily.    . vitamin C (ASCORBIC ACID) 500 MG tablet Take 500 mg by mouth daily.     No current facility-administered medications for this visit.     Family History  Problem Relation Age of Onset  . Hypertension Mother   . Diabetes Mother   . Diabetes Father   . Hypertension Father   . Hypertension Maternal Grandmother   . Hypertension Maternal Grandfather   . Prostate cancer Maternal Grandfather   . Lung cancer Paternal Grandfather        smoker  . Hypertension Paternal Grandfather   . Kidney Stones Sister   . Hypertension Paternal Grandmother   . Colon cancer Paternal Aunt        dx'd age 49-diedage [id number] . Colon cancer Paternal Aunt  dx'd 70 stage IV  . Colon cancer Paternal Aunt        dx'd late 34's, died age 78  . Breast cancer Maternal Aunt 45       bilateral mastectomy, no BRCA testing    ROS:  Pertinent items are noted in HPI.  Otherwise, a comprehensive ROS was negative.  Exam:   LMP 10/20/2003 (Exact Date)    Ht Readings from Last 3 Encounters:  09/16/17 5' 3.75" (1.619 m)  09/14/16 5' 4"  (1.626 m)  09/09/15 5' 3.5" (1.613 m)    General appearance: alert, cooperative and appears stated age Head: Normocephalic, without obvious abnormality, atraumatic Neck: no adenopathy, supple, symmetrical, trachea midline and thyroid enlarged on right, history of nodule and mildy enlarged on left, no nodule palpated inspection  and palpation Lungs: clear to auscultation bilaterally Breasts: normal appearance, no masses or tenderness, No nipple retraction or dimpling, No nipple discharge or bleeding, No axillary or supraclavicular adenopathy, Implants palpate intact, no masses noted Heart: regular rate and rhythm Abdomen: soft, non-tender; no masses,  no organomegaly Extremities: extremities normal, atraumatic, no cyanosis or edema Skin: Skin color, texture, turgor normal. No rashes or lesions Lymph nodes: Cervical, supraclavicular, and axillary nodes normal. No abnormal inguinal nodes palpated Neurologic: Grossly normal   Pelvic: External genitalia:  no lesions, normal female              Urethra:  normal appearing urethra with no masses, tenderness or lesions              Bartholin's and Skene's: normal                 Vagina: normal appearing vagina with normal color and discharge, no lesions              Cervix: absent              Pap taken: No. Bimanual Exam:  Uterus:  uterus absent              Adnexa: normal adnexa and no mass, fullness, tenderness               Rectovaginal: Confirms               Anus:  normal sphincter tone, no lesions  Chaperone present: yes  A:  Well Woman with normal exam  Menopausal with occasional symptoms  Recent mammogram with call back for right breast , patient has diagnostic mammogram scheduled for next week.   Known enlarged thyroid, no change    P:   Reviewed health and wellness pertinent to exam  Aware if vaginal bleeding to advise.  Discussed coconut oil for vaginal dryness prn if needed.   Discussed no issues palpated in right breast and that she will be aware of the concern if remains while she is at [facility] for follow up.  Continue follow up with PCP regarding thyroid as needed  Pap smear: no   counseled on breast self exam, mammography screening, feminine hygiene, adequate intake of calcium and vitamin D, diet and exercise  return annually or  prn  An After Visit Summary was printed and given to the patient.

## 2019-01-17 ENCOUNTER — Ambulatory Visit
Admission: RE | Admit: 2019-01-17 | Discharge: 2019-01-17 | Disposition: A | Discharge status: Home / Self Care | Payer: BC Managed Care – PPO | Source: Ambulatory Visit | Attending: Family Medicine | Admitting: Family Medicine

## 2019-01-17 ENCOUNTER — Other Ambulatory Visit: Payer: Self-pay

## 2019-01-17 DIAGNOSIS — R928 Other abnormal and inconclusive findings on diagnostic imaging of breast: Secondary | ICD-10-CM

## 2019-01-17 DIAGNOSIS — N6001 Solitary cyst of right breast: Secondary | ICD-10-CM | POA: Diagnosis not present

## 2019-02-21 DIAGNOSIS — M545 Low back pain: Secondary | ICD-10-CM | POA: Diagnosis not present

## 2019-03-05 DIAGNOSIS — M545 Low back pain: Secondary | ICD-10-CM | POA: Diagnosis not present

## 2019-08-06 ENCOUNTER — Encounter: Payer: Self-pay | Admitting: Certified Nurse Midwife

## 2019-09-24 DIAGNOSIS — Z79899 Other long term (current) drug therapy: Secondary | ICD-10-CM | POA: Diagnosis not present

## 2019-09-24 DIAGNOSIS — E042 Nontoxic multinodular goiter: Secondary | ICD-10-CM | POA: Diagnosis not present

## 2019-09-24 DIAGNOSIS — Z0000 Encounter for general adult medical examination without abnormal findings: Secondary | ICD-10-CM | POA: Diagnosis not present

## 2019-09-24 DIAGNOSIS — Z13220 Encounter for screening for lipoid disorders: Secondary | ICD-10-CM | POA: Diagnosis not present

## 2019-12-17 ENCOUNTER — Other Ambulatory Visit: Payer: Self-pay | Admitting: Family Medicine

## 2019-12-17 DIAGNOSIS — Z1231 Encounter for screening mammogram for malignant neoplasm of breast: Secondary | ICD-10-CM

## 2019-12-27 DIAGNOSIS — L218 Other seborrheic dermatitis: Secondary | ICD-10-CM | POA: Diagnosis not present

## 2019-12-27 DIAGNOSIS — L710 Perioral dermatitis: Secondary | ICD-10-CM | POA: Diagnosis not present

## 2020-01-14 ENCOUNTER — Ambulatory Visit: Payer: BC Managed Care – PPO | Admitting: Certified Nurse Midwife

## 2020-02-10 IMAGING — US US BREAST*R* LIMITED INC AXILLA
1 series · 5 of 5 positions shown · non-contrast
Comparison: January 09, 2019

CLINICAL DATA: 88-year-old patient recalled recent screening
mammogram for evaluation possible mass in the upper-outer right
breast.

EXAM:
DIGITAL DIAGNOSTIC RIGHT MAMMOGRAM WITH CAD AND TOMO
ULTRASOUND RIGHT BREAST

[Series 1: us breast*right* limited inc axilla · 0.07mm/px · 5 of 5 slices shown]
[im 1/5]
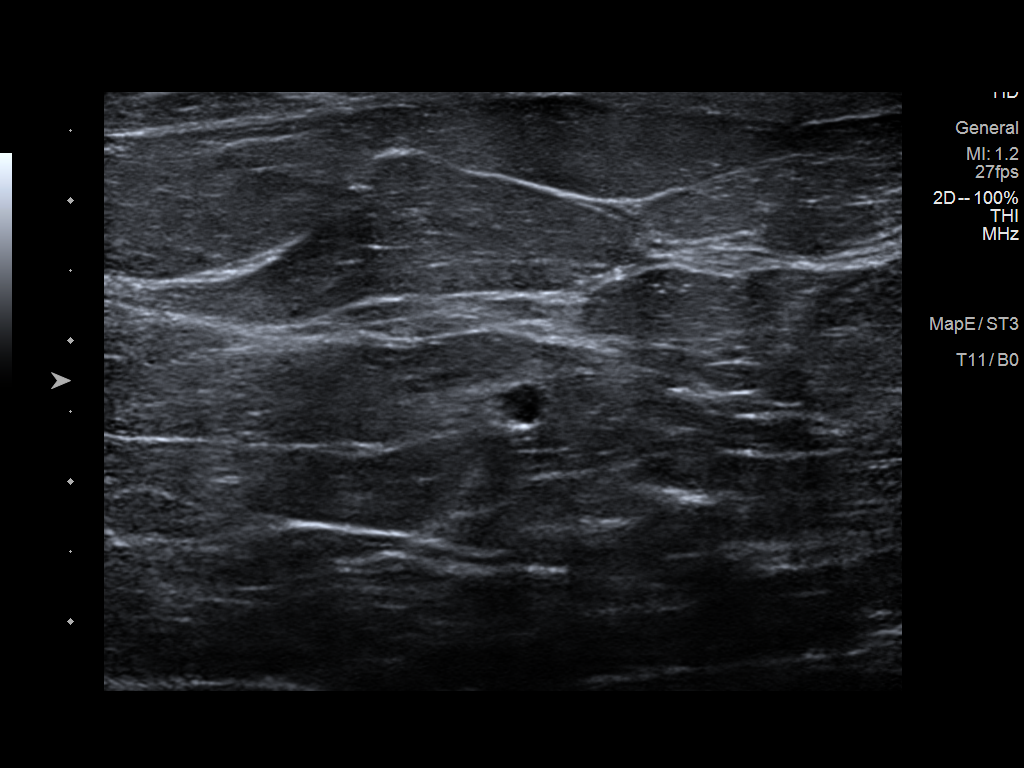
[im 2/5]
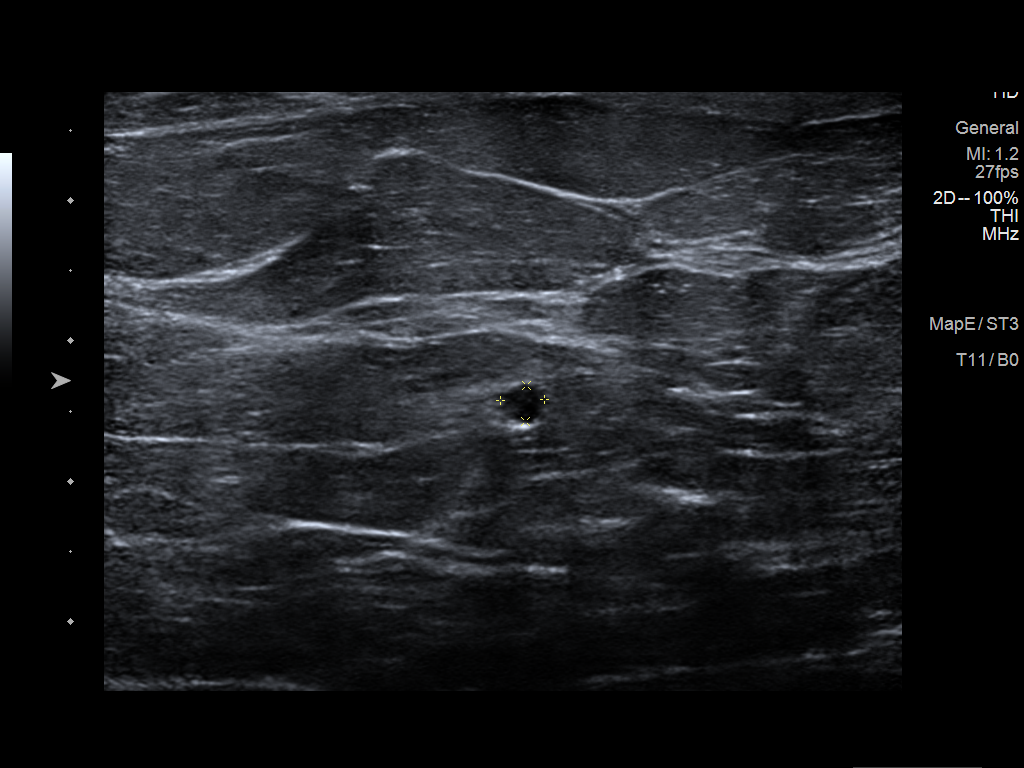
[im 3/5]
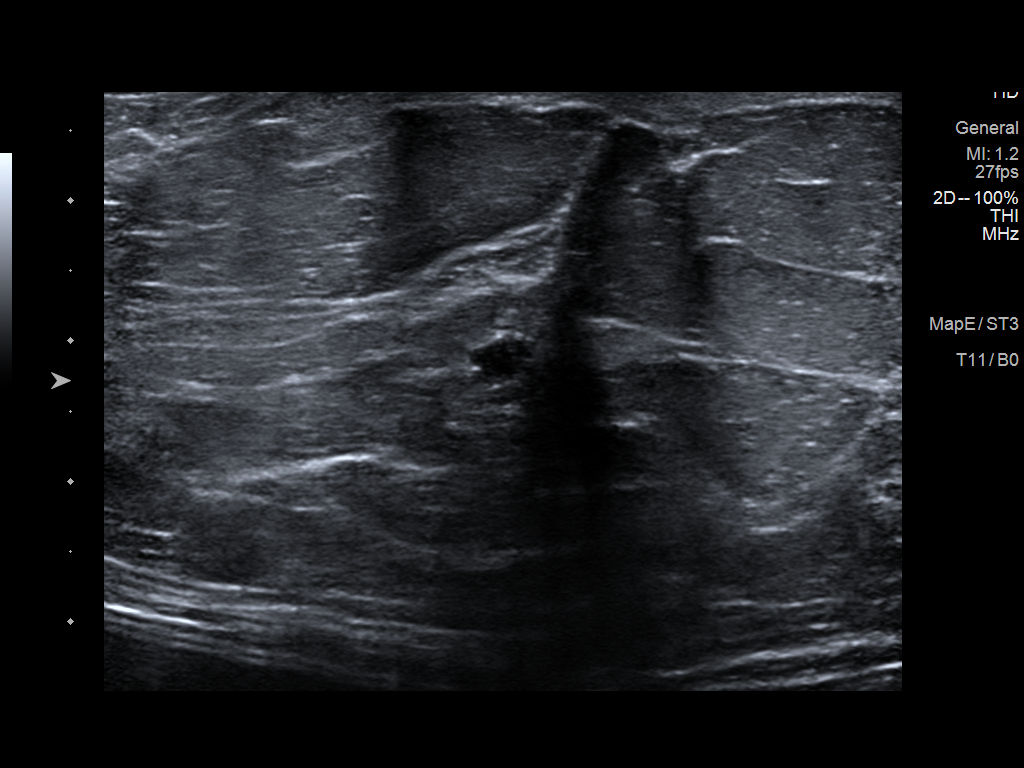
[im 4/5]
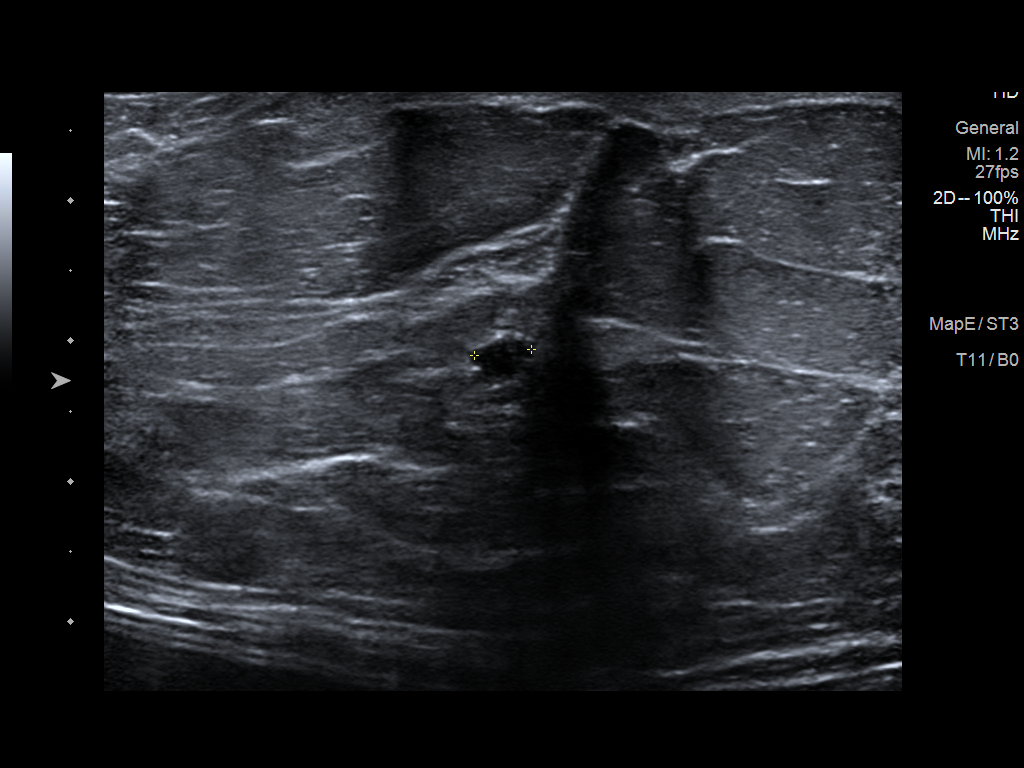
[im 5/5]
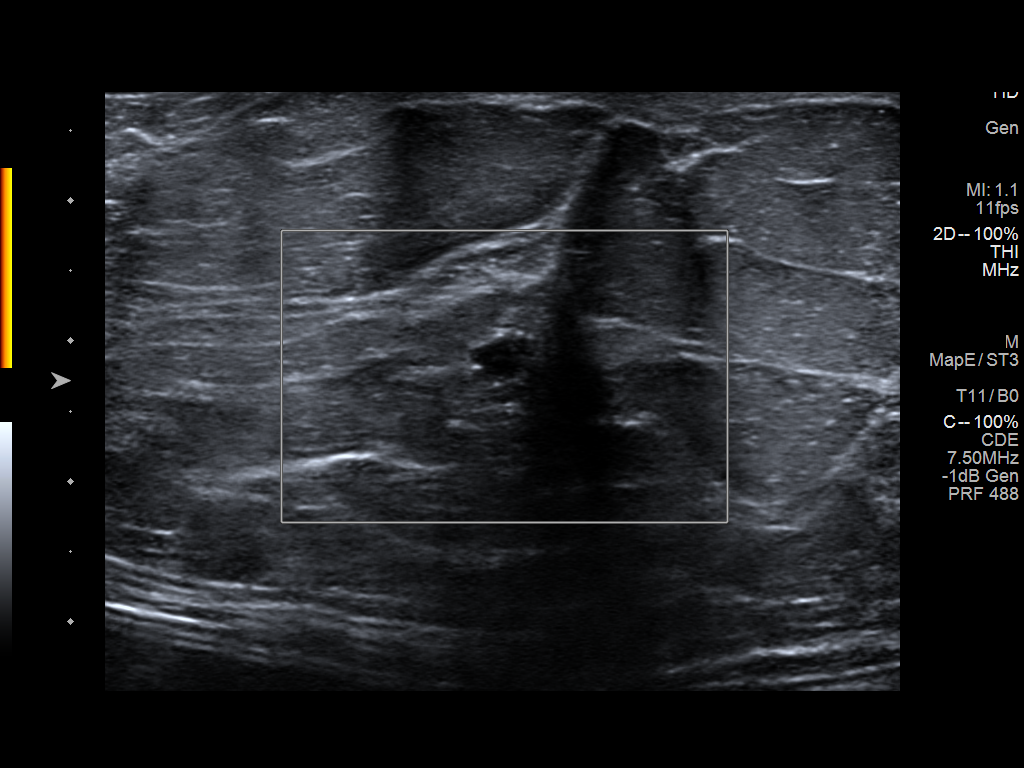

[5 of 5 positions shown; findings below may reference images not displayed]

ACR Breast Density Category b: There are scattered areas of
fibroglandular density.
FINDINGS: Spot compression views the right breast with the implant displaced
show a low-density nodule in the 10 o'clock retroareolar right
breast measures approximately 4 mm.

Mammographic images were processed with CAD.

Targeted ultrasound is performed, showing an oval cyst at 10 o'clock
retroareolar right breast measuring 4 x 3 x 3 mm. There is
well-defined posterior wall and posterior acoustic enhancement. No
internal vascular flow. No suspicious findings seen on ultrasound.
IMPRESSION: Benign 4 mm cyst in the 10 o'clock position the right breast. No
evidence of malignancy.

RECOMMENDATION:
Screening mammogram in one year.(Code:9A-M-JPF)

I have discussed the findings and recommendations with the patient.
Results were also provided in writing at the conclusion of the
visit. If applicable, a reminder letter will be sent to the patient
regarding the next appointment.

BI-RADS CATEGORY  2: Benign.

## 2020-02-18 ENCOUNTER — Ambulatory Visit: Payer: BC Managed Care – PPO

## 2020-02-21 ENCOUNTER — Other Ambulatory Visit: Payer: Self-pay

## 2020-02-21 ENCOUNTER — Ambulatory Visit
Admission: RE | Admit: 2020-02-21 | Discharge: 2020-02-21 | Disposition: A | Discharge status: Home / Self Care | Payer: BC Managed Care – PPO | Source: Ambulatory Visit | Attending: Family Medicine | Admitting: Family Medicine

## 2020-02-21 DIAGNOSIS — Z1231 Encounter for screening mammogram for malignant neoplasm of breast: Secondary | ICD-10-CM | POA: Diagnosis not present

## 2020-03-13 ENCOUNTER — Ambulatory Visit: Payer: BC Managed Care – PPO | Admitting: Obstetrics and Gynecology

## 2020-03-17 NOTE — Progress Notes (Signed)
49 y.o. [id number] Divorced Black or African American Not Hispanic or Latino female here for annual exam.  H/O TAH in 6/05 for fibroids.  No significant vasomotor symptoms. No vaginal dryness. Same long term partner x 9 years. No dyspareunia.  No bowel or bladder issues.    She has been vaccinated for covid.   Patient's last menstrual period was 10/20/2003 (exact date).          Sexually active: Yes.    The current method of family planning is post menopausal status.    Exercising: No.  The patient does not participate in regular exercise at present. Smoker:  no  Health Maintenance: Pap:  09-17-03 neg  History of abnormal Pap:  no MMG:  02/26/20 density B Bi-rads 1 neg  BMD:   None  Colonoscopy: 2020 F/u 5 years  TDaP:  2018  Gardasil: NA   reports that she has never smoked. She has never used smokeless tobacco. She reports that she does not drink alcohol and does not use drugs. Daughter, granddaughter is 2, grandson is 1 week. Local.  Parents are local, some health issues. She works in Engineer, technical sales.   Past Medical History:  Diagnosis Date  . Allergy   . Fibroid 10/29/2003   hx of--removed with hyst.  . Thyroid nodule 07/26/2008   both sides,  right thyroid biopsy X 2 07/2008 & 06/2013 benign  . Viral meningitis 1990    Past Surgical History:  Procedure Laterality Date  . ABDOMINAL HYSTERECTOMY  10-29-03   TAH d/t fibroids--Dr. Margaretha Glassing  . AUGMENTATION MAMMAPLASTY Bilateral 05-02-2009   w/lift--Saline  . BIOPSY THYROID Right 08/14/2008 & 07/11/2013   needle biopsy done on rt side - benign  . BREAST BIOPSY Right 2003   benign    Current Outpatient Medications  Medication Sig Dispense Refill  . Ascorbic Acid (VITAMIN C PO) Take 1,000 mg by mouth.    . Betamethasone Valerate 0.12 % foam 2 (two) times a week.    . Boswellia-Glucosamine-Vit D (OSTEO BI-FLEX ONE PER DAY PO) Take by mouth.    . loratadine (CLARITIN) 10 MG tablet Take 10 mg by mouth as needed.     . Multiple Vitamin  (MULTIVITAMIN) capsule Take 1 capsule by mouth daily.    . TURMERIC PO Take by mouth.     No current facility-administered medications for this visit.    Family History  Problem Relation Age of Onset  . Hypertension Mother   . Diabetes Mother   . Diabetes Father   . Hypertension Father   . Hypertension Maternal Grandmother   . Hypertension Maternal Grandfather   . Prostate cancer Maternal Grandfather   . Lung cancer Paternal Grandfather        smoker  . Hypertension Paternal Grandfather   . Kidney Stones Sister   . Hypertension Paternal Grandmother   . Colon cancer Paternal Aunt        dx'd age 29-died age 48  . Colon cancer Paternal Aunt        dx'd 27 stage IV  . Colon cancer Paternal Aunt        dx'd late 49's, died age 12  . Breast cancer Maternal Aunt 45       bilateral mastectomy, no BRCA testing  . Breast cancer Cousin   Maternal first cousin with breast cancer in her 4's (now 26).  Maternal aunt with breast cancer in her 20's.   Review of Systems  All other systems reviewed and are negative.  Exam:   BP 110/72   Pulse 82   Ht 5' 3.75" (1.619 m)   Wt 230 lb (104.3 kg)   LMP 10/20/2003 (Exact Date)   SpO2 99%   BMI 39.79 kg/m   Weight change: @WEIGHTCHANGE @ Height:   Height: 5' 3.75" (161.9 cm)  Ht Readings from Last 3 Encounters:  03/18/20 5' 3.75" (1.619 m)  01/12/19 5' 3.75" (1.619 m)  09/16/17 5' 3.75" (1.619 m)    General appearance: alert, cooperative and appears stated age Head: Normocephalic, without obvious abnormality, atraumatic Neck: no adenopathy, supple, symmetrical, trachea midline and thyroid normal to inspection and palpation Lungs: clear to auscultation bilaterally Cardiovascular: regular rate and rhythm Breasts: normal appearance, no masses or tenderness, bilateral implants Abdomen: soft, non-tender; non distended,  no masses,  no organomegaly Extremities: extremities normal, atraumatic, no cyanosis or edema Skin: Skin color,  texture, turgor normal. No rashes or lesions Lymph nodes: Cervical, supraclavicular, and axillary nodes normal. No abnormal inguinal nodes palpated Neurologic: Grossly normal   Pelvic: External genitalia:  no lesions              Urethra:  normal appearing urethra with no masses, tenderness or lesions              Bartholins and Skenes: normal                 Vagina: normal appearing vagina with normal color and discharge, no lesions              Cervix: absent               Bimanual Exam:  Uterus:  uterus absent              Adnexa: no mass, fullness, tenderness               Rectovaginal: Confirms               Anus:  normal sphincter tone, no lesions  Gae Dry chaperoned for the exam.  A:  Well Woman with normal exam  Family history of DM, colon cancer and breast cancer  BMI 39  P:   Discussed referral to genetics, she will consider  Working on weight loss  # for weight loss clinic given.  Labs UTD with her primary, also get screened at work.   Discussed breast self exam  Discussed calcium and vit D intake  Mammogram and colonoscopy are UTD  Information on perimenopause given

## 2020-03-18 ENCOUNTER — Encounter: Payer: Self-pay | Admitting: Obstetrics and Gynecology

## 2020-03-18 ENCOUNTER — Other Ambulatory Visit: Payer: Self-pay

## 2020-03-18 ENCOUNTER — Ambulatory Visit: Payer: BC Managed Care – PPO | Admitting: Obstetrics and Gynecology

## 2020-03-18 VITALS — BP 110/72 | HR 82 | Ht 63.75 in | Wt 230.0 lb

## 2020-03-18 DIAGNOSIS — Z833 Family history of diabetes mellitus: Secondary | ICD-10-CM | POA: Diagnosis not present

## 2020-03-18 DIAGNOSIS — E041 Nontoxic single thyroid nodule: Secondary | ICD-10-CM | POA: Diagnosis not present

## 2020-03-18 DIAGNOSIS — Z01419 Encounter for gynecological examination (general) (routine) without abnormal findings: Secondary | ICD-10-CM | POA: Diagnosis not present

## 2020-03-18 DIAGNOSIS — Z90710 Acquired absence of both cervix and uterus: Secondary | ICD-10-CM | POA: Diagnosis not present

## 2020-03-18 NOTE — Patient Instructions (Signed)
EXERCISE AND DIET:  We recommended that you start or continue a regular exercise program for good health. Regular exercise means any activity that makes your heart beat faster and makes you sweat.  We recommend exercising at least 30 minutes per day at least 3 days a week, preferably 4 or 5.  We also recommend a diet low in fat and sugar.  Inactivity, poor dietary choices and obesity can cause diabetes, heart attack, stroke, and kidney damage, among others.    ALCOHOL AND SMOKING:  Women should limit their alcohol intake to no more than 7 drinks/beers/glasses of wine (combined, not each!) per week. Moderation of alcohol intake to this level decreases your risk of breast cancer and liver damage. And of course, no recreational drugs are part of a healthy lifestyle.  And absolutely no smoking or even second hand smoke. Most people know smoking can cause heart and lung diseases, but did you know it also contributes to weakening of your bones? Aging of your skin?  Yellowing of your teeth and nails?  CALCIUM AND VITAMIN D:  Adequate intake of calcium and Vitamin D are recommended.  The recommendations for exact amounts of these supplements seem to change often, but generally speaking 1,000 mg of calcium (between diet and supplement) and 800 units of Vitamin D per day seems prudent. Certain women may benefit from higher intake of Vitamin D.  If you are among these women, your doctor will have told you during your visit.    PAP SMEARS:  Pap smears, to check for cervical cancer or precancers,  have traditionally been done yearly, although recent scientific advances have shown that most women can have pap smears less often.  However, every woman still should have a physical exam from her gynecologist every year. It will include a breast check, inspection of the vulva and vagina to check for abnormal growths or skin changes, a visual exam of the cervix, and then an exam to evaluate the size and shape of the uterus and  ovaries.  And after 50 years of age, a rectal exam is indicated to check for rectal cancers. We will also provide age appropriate advice regarding health maintenance, like when you should have certain vaccines, screening for sexually transmitted diseases, bone density testing, colonoscopy, mammograms, etc.   MAMMOGRAMS:  All women over 40 years old should have a yearly mammogram. Many facilities now offer a "3D" mammogram, which may cost around $50 extra out of pocket. If possible,  we recommend you accept the option to have the 3D mammogram performed.  It both reduces the number of women who will be called back for extra views which then turn out to be normal, and it is better than the routine mammogram at detecting truly abnormal areas.    COLON CANCER SCREENING: Now recommend starting at age 45. At this time colonoscopy is not covered for routine screening until 50. There are take home tests that can be done between 45-49.   COLONOSCOPY:  Colonoscopy to screen for colon cancer is recommended for all women at age 50.  We know, you hate the idea of the prep.  We agree, BUT, having colon cancer and not knowing it is worse!!  Colon cancer so often starts as a polyp that can be seen and removed at colonscopy, which can quite literally save your life!  And if your first colonoscopy is normal and you have no family history of colon cancer, most women don't have to have it again for   10 years.  Once every ten years, you can do something that may end up saving your life, right?  We will be happy to help you get it scheduled when you are ready.  Be sure to check your insurance coverage so you understand how much it will cost.  It may be covered as a preventative service at no cost, but you should check your particular policy.      Breast Self-Awareness Breast self-awareness means being familiar with how your breasts look and feel. It involves checking your breasts regularly and reporting any changes to your  health care provider. Practicing breast self-awareness is important. A change in your breasts can be a sign of a serious medical problem. Being familiar with how your breasts look and feel allows you to find any problems early, when treatment is more likely to be successful. All women should practice breast self-awareness, including women who have had breast implants. How to do a breast self-exam One way to learn what is normal for your breasts and whether your breasts are changing is to do a breast self-exam. To do a breast self-exam: Look for Changes  1. Remove all the clothing above your waist. 2. Stand in front of a mirror in a room with good lighting. 3. Put your hands on your hips. 4. Push your hands firmly downward. 5. Compare your breasts in the mirror. Look for differences between them (asymmetry), such as: ? Differences in shape. ? Differences in size. ? Puckers, dips, and bumps in one breast and not the other. 6. Look at each breast for changes in your skin, such as: ? Redness. ? Scaly areas. 7. Look for changes in your nipples, such as: ? Discharge. ? Bleeding. ? Dimpling. ? Redness. ? A change in position. Feel for Changes Carefully feel your breasts for lumps and changes. It is best to do this while lying on your back on the floor and again while sitting or standing in the shower or tub with soapy water on your skin. Feel each breast in the following way:  Place the arm on the side of the breast you are examining above your head.  Feel your breast with the other hand.  Start in the nipple area and make  inch (2 cm) overlapping circles to feel your breast. Use the pads of your three middle fingers to do this. Apply light pressure, then medium pressure, then firm pressure. The light pressure will allow you to feel the tissue closest to the skin. The medium pressure will allow you to feel the tissue that is a little deeper. The firm pressure will allow you to feel the tissue  close to the ribs.  Continue the overlapping circles, moving downward over the breast until you feel your ribs below your breast.  Move one finger-width toward the center of the body. Continue to use the  inch (2 cm) overlapping circles to feel your breast as you move slowly up toward your collarbone.  Continue the up and down exam using all three pressures until you reach your armpit.  Write Down What You Find  Write down what is normal for each breast and any changes that you find. Keep a written record with breast changes or normal findings for each breast. By writing this information down, you do not need to depend only on memory for size, tenderness, or location. Write down where you are in your menstrual cycle, if you are still menstruating. If you are having trouble noticing differences   in your breasts, do not get discouraged. With time you will become more familiar with the variations in your breasts and more comfortable with the exam. How often should I examine my breasts? Examine your breasts every month. If you are breastfeeding, the best time to examine your breasts is after a feeding or after using a breast pump. If you menstruate, the best time to examine your breasts is 5-7 days after your period is over. During your period, your breasts are lumpier, and it may be more difficult to notice changes. When should I see my health care provider? See your health care provider if you notice:  A change in shape or size of your breasts or nipples.  A change in the skin of your breast or nipples, such as a reddened or scaly area.  Unusual discharge from your nipples.  A lump or thick area that was not there before.  Pain in your breasts.  Anything that concerns you.   Perimenopause  Perimenopause is the normal time of life before and after menstrual periods stop completely (menopause). Perimenopause can begin 2-8 years before menopause, and it usually lasts for 1 year after  menopause. During perimenopause, the ovaries may or may not produce an egg. What are the causes? This condition is caused by a natural change in hormone levels that happens as you get older. What increases the risk? This condition is more likely to start at an earlier age if you have certain medical conditions or treatments, including:  A tumor of the pituitary gland in the brain.  A disease that affects the ovaries and hormone production.  Radiation treatment for cancer.  Certain cancer treatments, such as chemotherapy or hormone (anti-estrogen) therapy.  Heavy smoking and excessive alcohol use.  Family history of early menopause. What are the signs or symptoms? Perimenopausal changes affect each woman differently. Symptoms of this condition may include:  Hot flashes.  Night sweats.  Irregular menstrual periods.  Decreased sex drive.  Vaginal dryness.  Headaches.  Mood swings.  Depression.  Memory problems or trouble concentrating.  Irritability.  Tiredness.  Weight gain.  Anxiety.  Trouble getting pregnant. How is this diagnosed? This condition is diagnosed based on your medical history, a physical exam, your age, your menstrual history, and your symptoms. Hormone tests may also be done. How is this treated? In some cases, no treatment is needed. You and your health care provider should make a decision together about whether treatment is necessary. Treatment will be based on your individual condition and preferences. Various treatments are available, such as:  Menopausal hormone therapy (MHT).  Medicines to treat specific symptoms.  Acupuncture.  Vitamin or herbal supplements. Before starting treatment, make sure to let your health care provider know if you have a personal or family history of:  Heart disease.  Breast cancer.  Blood clots.  Diabetes.  Osteoporosis. Follow these instructions at home: Lifestyle  Do not use any products that  contain nicotine or tobacco, such as cigarettes and e-cigarettes. If you need help quitting, ask your health care provider.  Eat a balanced diet that includes fresh fruits and vegetables, whole grains, soybeans, eggs, lean meat, and low-fat dairy.  Get at least 30 minutes of physical activity on 5 or more days each week.  Avoid alcoholic and caffeinated beverages, as well as spicy foods. This may help prevent hot flashes.  Get 7-8 hours of sleep each night.  Dress in layers that can be removed to help you manage hot   flashes.  Find ways to manage stress, such as deep breathing, meditation, or journaling. General instructions  Keep track of your menstrual periods, including: ? When they occur. ? How heavy they are and how long they last. ? How much time passes between periods.  Keep track of your symptoms, noting when they start, how often you have them, and how long they last.  Take over-the-counter and prescription medicines only as told by your health care provider.  Take vitamin supplements only as told by your health care provider. These may include calcium, vitamin E, and vitamin D.  Use vaginal lubricants or moisturizers to help with vaginal dryness and improve comfort during sex.  Talk with your health care provider before starting any herbal supplements.  Keep all follow-up visits as told by your health care provider. This is important. This includes any group therapy or counseling. Contact a health care provider if:  You have heavy vaginal bleeding or pass blood clots.  Your period lasts more than 2 days longer than normal.  Your periods are recurring sooner than 21 days.  You bleed after having sex. Get help right away if:  You have chest pain, trouble breathing, or trouble talking.  You have severe depression.  You have pain when you urinate.  You have severe headaches.  You have vision problems. Summary  Perimenopause is the time when a woman's body  begins to move into menopause. This may happen naturally or as a result of other health problems or medical treatments.  Perimenopause can begin 2-8 years before menopause, and it usually lasts for 1 year after menopause.  Perimenopausal symptoms can be managed through medicines, lifestyle changes, and complementary therapies such as acupuncture. This information is not intended to replace advice given to you by your health care provider. Make sure you discuss any questions you have with your health care provider. Document Revised: 04/15/2017 Document Reviewed: 06/08/2016 Elsevier Patient Education  2020 [employer]  

## 2020-09-14 ENCOUNTER — Ambulatory Visit (HOSPITAL_COMMUNITY)
Admission: EM | Admit: 2020-09-14 | Discharge: 2020-09-14 | Disposition: A | Discharge status: Home / Self Care | Payer: BC Managed Care – PPO | Attending: Medical Oncology | Admitting: Medical Oncology

## 2020-09-14 ENCOUNTER — Other Ambulatory Visit: Payer: Self-pay

## 2020-09-14 ENCOUNTER — Encounter (HOSPITAL_COMMUNITY): Payer: Self-pay | Admitting: *Deleted

## 2020-09-14 DIAGNOSIS — R300 Dysuria: Secondary | ICD-10-CM

## 2020-09-14 DIAGNOSIS — J00 Acute nasopharyngitis [common cold]: Secondary | ICD-10-CM

## 2020-09-14 LAB — POCT URINALYSIS DIPSTICK, ED / UC
Bilirubin Urine: NEGATIVE
Glucose, UA: NEGATIVE mg/dL
Ketones, ur: NEGATIVE mg/dL
Leukocytes,Ua: NEGATIVE
Nitrite: NEGATIVE
Protein, ur: NEGATIVE mg/dL
Specific Gravity, Urine: 1.025 (ref 1.005–1.030)
Urobilinogen, UA: 0.2 mg/dL (ref 0.0–1.0)
pH: 5 (ref 5.0–8.0)

## 2020-09-14 MED ORDER — BENZONATATE 100 MG PO CAPS: 100.0000 mg | ORAL_CAPSULE | Freq: Three times a day (TID) | ORAL | 0 refills | Status: DC

## 2020-09-14 MED ORDER — FLUTICASONE PROPIONATE 50 MCG/ACT NA SUSP
2.0000 | Freq: Every day | NASAL | 0 refills | Status: DC
Start: 2020-09-14 — End: 2021-03-23

## 2020-09-14 NOTE — ED Provider Notes (Signed)
[facility]    CSN: [id number] Arrival date & time: 09/14/20  1001      History   Chief Complaint Chief Complaint  Patient presents with  . Otalgia    Rt/LT  . Urinary Tract Infection  . Cough    HPI Breanna Thompson is a 50 y.o. female.   HPI   Otalgia and cough: Pt reports that for the past week he has had bilateral ear pain, cough and yesterday she noticed urinary frequency, dysuria. She has tried OTC cough and cold medications without much improvement. She denies fevers, SOB, chest pain, abdominal pain, vomiting. NO known sick contacts.   Past Medical History:  Diagnosis Date  . Allergy   . Fibroid 10/29/2003   hx of--removed with hyst.  . Thyroid nodule 07/26/2008   both sides,  right thyroid biopsy X 2 07/2008 & 06/2013 benign  . Viral meningitis 1990    Patient Active Problem List   Diagnosis Date Noted  . S/P abdominal hysterectomy 06/27/2014  . Thyroid nodule 06/27/2014  . FH: colon cancer in relative diagnosed at >89 years old 06/27/2014    Past Surgical History:  Procedure Laterality Date  . ABDOMINAL HYSTERECTOMY  10-29-03   TAH d/t fibroids--Dr. Margaretha Glassing  . AUGMENTATION MAMMAPLASTY Bilateral 05-02-2009   w/lift--[facility]  . BIOPSY THYROID Right 08/14/2008 & 07/11/2013   needle biopsy done on rt side - benign  . BREAST BIOPSY Right 2003   benign    OB History    Gravida  3   Para  1   Term  1   Preterm  0   AB  2   Living  1     SAB  2   IAB  0   Ectopic  0   Multiple  0   Live Births  1            Home Medications    Prior to Admission medications   Medication Sig Start Date End Date Taking? Authorizing Provider  Ascorbic Acid (VITAMIN C PO) Take 1,000 mg by mouth.    [provider]  Betamethasone Valerate 0.12 % foam 2 (two) times a week. 06/14/13   [provider]  Boswellia-Glucosamine-Vit D (OSTEO BI-FLEX ONE PER DAY PO) Take by mouth.    [provider]  loratadine (CLARITIN)  10 MG tablet Take 10 mg by mouth as needed.     [provider]  Multiple Vitamin (MULTIVITAMIN) capsule Take 1 capsule by mouth daily.    [provider]  TURMERIC PO Take by mouth.    [provider]    Family History Family History  Problem Relation Age of Onset  . Hypertension Mother   . Diabetes Mother   . Diabetes Father   . Hypertension Father   . Hypertension Maternal Grandmother   . Hypertension Maternal Grandfather   . Prostate cancer Maternal Grandfather   . Lung cancer Paternal Grandfather        smoker  . Hypertension Paternal Grandfather   . Kidney Stones Sister   . Hypertension Paternal Grandmother   . Colon cancer Paternal Aunt        dx'd age 27-died age 32  . Colon cancer Paternal Aunt        dx'd 10 stage IV  . Colon cancer Paternal Aunt        dx'd late 46's, died age 17  . Breast cancer Maternal Aunt 23  bilateral mastectomy, no BRCA testing  . Breast cancer Cousin     Social History Social History   Tobacco Use  . Smoking status: Never Smoker  . Smokeless tobacco: Never Used  Vaping Use  . Vaping Use: Never used  Substance Use Topics  . Alcohol use: No  . Drug use: No     Allergies   Patient has no known allergies.   Review of Systems Review of Systems  As stated above in HPI Physical Exam Triage Vital Signs ED Triage Vitals  Enc Vitals Group     BP 09/14/20 1015 (!) 144/98     Pulse Rate 09/14/20 1015 100     Resp 09/14/20 1015 20     Temp 09/14/20 1015 98.9 F (37.2 C)     Temp src --      SpO2 09/14/20 1015 97 %     Weight --      Height --      Head Circumference --      Peak Flow --      Pain Score 09/14/20 1012 0     Pain Loc --      Pain Edu? --      Excl. in [place]? --    No data found.  Updated Vital Signs BP (!) 144/98 (BP Location: Right Arm)   Pulse 100   Temp 98.9 F (37.2 C)   Resp 20   LMP 10/20/2003 (Exact Date)   SpO2 97%   Physical Exam Vitals and nursing note  reviewed.  Constitutional:      General: She is not in acute distress.    Appearance: Normal appearance. She is not ill-appearing, toxic-appearing or diaphoretic.  HENT:     Head: Normocephalic and atraumatic.     Right Ear: Tympanic membrane, ear canal and external ear normal.     Left Ear: Tympanic membrane, ear canal and external ear normal.     Nose: Nose normal.     Mouth/Throat:     Mouth: Mucous membranes are moist.  Eyes:     Extraocular Movements: Extraocular movements intact.     Pupils: Pupils are equal, round, and reactive to light.  Cardiovascular:     Rate and Rhythm: Normal rate and regular rhythm.     Heart sounds: Normal heart sounds.  Pulmonary:     Effort: Pulmonary effort is normal.     Breath sounds: Normal breath sounds.  Abdominal:     Palpations: Abdomen is soft.  Musculoskeletal:     Cervical back: Normal range of motion and neck supple.  Lymphadenopathy:     Cervical: No cervical adenopathy.  Skin:    General: Skin is warm.  Neurological:     Mental Status: She is alert and oriented to person, place, and time.      UC Treatments / Results  Labs (all labs ordered are listed, but only abnormal results are displayed) Labs Reviewed  URINE CULTURE  POCT URINALYSIS DIPSTICK, ED / UC    EKG   Radiology No results found.  Procedures Procedures (including critical care time)  Medications Ordered in UC Medications - No data to display  Initial Impression / Assessment and Plan / UC Course  I have reviewed the triage vital signs and the nursing notes.  Pertinent labs & imaging results that were available during my care of the patient were reviewed by me and considered in my medical decision making (see chart for details).     New.  Likely  viral in nature which I discussed with patient.  Sending in Nekoma and Flonase to help with her symptoms in the meantime.  We will culture her urine to ensure no sign of urinary tract infection and will  treat as appropriate.  Hydration with water and rest encouraged. Discussed red flag signs and symptoms.  Final Clinical Impressions(s) / UC Diagnoses   Final diagnoses:  None   Discharge Instructions   None    ED Prescriptions    None     PDMP not reviewed this encounter.   Hughie Closs, [place] 09/14/20 1100

## 2020-09-14 NOTE — ED Triage Notes (Signed)
Pt reports Sx's started last Suday . Pt has used OTC with out relief.

## 2020-09-15 LAB — URINE CULTURE: Culture: NO GROWTH

## 2020-09-22 DIAGNOSIS — J209 Acute bronchitis, unspecified: Secondary | ICD-10-CM | POA: Diagnosis not present

## 2020-09-22 DIAGNOSIS — J3489 Other specified disorders of nose and nasal sinuses: Secondary | ICD-10-CM | POA: Diagnosis not present

## 2020-09-22 DIAGNOSIS — U071 COVID-19: Secondary | ICD-10-CM | POA: Diagnosis not present

## 2020-09-22 DIAGNOSIS — R059 Cough, unspecified: Secondary | ICD-10-CM | POA: Diagnosis not present

## 2020-10-15 DIAGNOSIS — Z0000 Encounter for general adult medical examination without abnormal findings: Secondary | ICD-10-CM | POA: Diagnosis not present

## 2020-10-15 DIAGNOSIS — Z13220 Encounter for screening for lipoid disorders: Secondary | ICD-10-CM | POA: Diagnosis not present

## 2020-12-03 DIAGNOSIS — B354 Tinea corporis: Secondary | ICD-10-CM | POA: Diagnosis not present

## 2020-12-03 DIAGNOSIS — L700 Acne vulgaris: Secondary | ICD-10-CM | POA: Diagnosis not present

## 2020-12-03 DIAGNOSIS — L218 Other seborrheic dermatitis: Secondary | ICD-10-CM | POA: Diagnosis not present

## 2021-01-22 ENCOUNTER — Other Ambulatory Visit: Payer: Self-pay | Admitting: Family Medicine

## 2021-01-22 DIAGNOSIS — Z1231 Encounter for screening mammogram for malignant neoplasm of breast: Secondary | ICD-10-CM

## 2021-02-17 DIAGNOSIS — L308 Other specified dermatitis: Secondary | ICD-10-CM | POA: Diagnosis not present

## 2021-02-24 ENCOUNTER — Other Ambulatory Visit: Payer: Self-pay

## 2021-02-24 ENCOUNTER — Ambulatory Visit
Admission: RE | Admit: 2021-02-24 | Discharge: 2021-02-24 | Disposition: A | Discharge status: Home / Self Care | Payer: BC Managed Care – PPO | Source: Ambulatory Visit | Attending: Family Medicine | Admitting: Family Medicine

## 2021-02-24 DIAGNOSIS — Z1231 Encounter for screening mammogram for malignant neoplasm of breast: Secondary | ICD-10-CM | POA: Diagnosis not present

## 2021-03-23 ENCOUNTER — Ambulatory Visit (INDEPENDENT_AMBULATORY_CARE_PROVIDER_SITE_OTHER): Payer: BC Managed Care – PPO | Admitting: Obstetrics and Gynecology

## 2021-03-23 ENCOUNTER — Other Ambulatory Visit: Payer: Self-pay

## 2021-03-23 ENCOUNTER — Encounter: Payer: Self-pay | Admitting: Obstetrics and Gynecology

## 2021-03-23 VITALS — BP 118/82 | HR 90 | Ht 64.0 in | Wt 237.2 lb

## 2021-03-23 DIAGNOSIS — N951 Menopausal and female climacteric states: Secondary | ICD-10-CM

## 2021-03-23 DIAGNOSIS — Z90710 Acquired absence of both cervix and uterus: Secondary | ICD-10-CM

## 2021-03-23 DIAGNOSIS — Z01419 Encounter for gynecological examination (general) (routine) without abnormal findings: Secondary | ICD-10-CM | POA: Diagnosis not present

## 2021-03-23 DIAGNOSIS — Z800 Family history of malignant neoplasm of digestive organs: Secondary | ICD-10-CM

## 2021-03-23 DIAGNOSIS — Z833 Family history of diabetes mellitus: Secondary | ICD-10-CM | POA: Diagnosis not present

## 2021-03-23 DIAGNOSIS — N898 Other specified noninflammatory disorders of vagina: Secondary | ICD-10-CM

## 2021-03-23 DIAGNOSIS — Z803 Family history of malignant neoplasm of breast: Secondary | ICD-10-CM

## 2021-03-23 NOTE — Patient Instructions (Addendum)

## 2021-03-23 NOTE — Progress Notes (Signed)
50 y.o. [id number] Divorced Black or African American Not Hispanic or Latino female here for annual exam. H/O hysterectomy in 6/05 for fibroids.  She has questions about menopause. Reports some internal heat unsure if hot flashes. She is having some night sweats, just on occasion.  Currently symptoms are tolerable. She has some vaginal dryness, using vaginal lubrication. She has started using a vaginal moisturizer.  No vaginitis c/o.   No bowel or bladder c/o.    She just joined [employer]. She has lost 7 lbs in 2-3 weeks.   Patient's last menstrual period was 10/20/2003 (exact date).          Sexually active: Yes.    The current method of family planning is status post hysterectomy.    Exercising: Yes.    3-4 days a week.  Smoker:  no  Health Maintenance: Pap:   09-17-03 neg  History of abnormal Pap:  no MMG:  02/28/21 Bi-rads 1 normal  BMD:   none  Colonoscopy: 2020, normal,  F/u 5 years  TDaP:  2018 Gardasil: n/a   reports that she has never smoked. She has never used smokeless tobacco. She reports that she does not drink alcohol and does not use drugs. Just occasional ETOH. She works in Engineer, technical sales. Her daughter and her 53 kids (61 year old girl and one year old boy) are local.   Past Medical History:  Diagnosis Date   Allergy    Fibroid 10/29/2003   hx of--removed with hyst.   Thyroid nodule 07/26/2008   both sides,  right thyroid biopsy X 2 07/2008 & 06/2013 benign   Viral meningitis 1990    Past Surgical History:  Procedure Laterality Date   ABDOMINAL HYSTERECTOMY  10-29-03   TAH d/t fibroids--Dr. Margaretha Glassing   AUGMENTATION MAMMAPLASTY Bilateral 05-02-2009   w/lift--[facility]   BIOPSY THYROID Right 08/14/2008 & 07/11/2013   needle biopsy done on rt side - benign   BREAST BIOPSY Right 2003   benign    Current Outpatient Medications  Medication Sig Dispense Refill   Ascorbic Acid (VITAMIN C PO) Take 1,000 mg by mouth.     Betamethasone Valerate 0.12 % foam 2 (two) times a week.      Boswellia-Glucosamine-Vit D (OSTEO BI-FLEX ONE PER DAY PO) Take by mouth.     loratadine (CLARITIN) 10 MG tablet Take 10 mg by mouth as needed.      Multiple Vitamin (MULTIVITAMIN) capsule Take 1 capsule by mouth daily.     triamcinolone cream (KENALOG) 0.1 % SMARTSIG:Sparingly Topical Twice Daily     No current facility-administered medications for this visit.    Family History  Problem Relation Age of Onset   Hypertension Mother    Diabetes Mother    Diabetes Father    Hypertension Father    Hypertension Maternal Grandmother    Hypertension Maternal Grandfather    Prostate cancer Maternal Grandfather    Lung cancer Paternal Grandfather        smoker   Hypertension Paternal Grandfather    Kidney Stones Sister    Hypertension Paternal Grandmother    Colon cancer Paternal Aunt        dx'd age 79-died age 6   Colon cancer Paternal Aunt        dx'd 14 stage IV   Colon cancer Paternal Aunt        dx'd late 51's, died age 78   Breast cancer Maternal Aunt 65       bilateral mastectomy, no  BRCA testing   Breast cancer Cousin   Mom was one of 7 girls. Maternal cousin with breast cancer in her 2's  Review of Systems  Genitourinary:        Vaginal dryness   Exam:   BP 118/82   Pulse 90   Ht 5' 4" (1.626 m)   Wt 237 lb 3.2 oz (107.6 kg)   LMP 10/20/2003 (Exact Date)   SpO2 97%   BMI 40.72 kg/m   Weight change: _0 @ Height:   Height: 5' 4" (162.6 cm)  Ht Readings from Last 3 Encounters:  03/23/21 5' 4" (1.626 m)  03/18/20 5' 3.75" (1.619 m)  01/12/19 5' 3.75" (1.619 m)    General appearance: alert, cooperative and appears stated age Head: Normocephalic, without obvious abnormality, atraumatic Neck: no adenopathy, supple, symmetrical, trachea midline and thyroid normal to inspection and palpation Lungs: clear to auscultation bilaterally Cardiovascular: regular rate and rhythm Breasts: normal appearance, no masses or tenderness Abdomen: soft, non-tender;  non distended,  no masses,  no organomegaly Extremities: extremities normal, atraumatic, no cyanosis or edema Skin: Skin color, texture, turgor normal. No rashes or lesions Lymph nodes: Cervical, supraclavicular, and axillary nodes normal. No abnormal inguinal nodes palpated Neurologic: Grossly normal   Pelvic: External genitalia:  no lesions              Urethra:  normal appearing urethra with no masses, tenderness or lesions              Bartholins and Skenes: normal                 Vagina: mildly atrophic appearing vagina with normal color and discharge, no lesions              Cervix: absent               Bimanual Exam:  Uterus:  uterus absent              Adnexa: no mass, fullness, tenderness               Rectovaginal: Confirms               Anus:  normal sphincter tone, no lesions  Gae Dry chaperoned for the exam.  1. Well woman exam Discussed breast self exam Discussed calcium and vit D intake Mammogram and colonoscopy are UTD No pap needed Labs with primary  2. S/P abdominal hysterectomy  3. Perimenopause Discussed behavioral changes, avoiding triggers Call if problematic  4. Family history of diabetes mellitus (DM) Labs with primary  5. FH: colon cancer in relative diagnosed at >11 years old Colonoscopy is UTD Declines referral to Genetics  6. Family history of breast cancer Mammogram UTD Declines referral to Genetics  7. Vaginal dryness She will reach out if lubrication and vaginal moisturizers aren't helping Briefly discussed vaginal estrogen

## 2021-10-28 DIAGNOSIS — Z13220 Encounter for screening for lipoid disorders: Secondary | ICD-10-CM | POA: Diagnosis not present

## 2021-10-28 DIAGNOSIS — E042 Nontoxic multinodular goiter: Secondary | ICD-10-CM | POA: Diagnosis not present

## 2021-10-28 DIAGNOSIS — Z0000 Encounter for general adult medical examination without abnormal findings: Secondary | ICD-10-CM | POA: Diagnosis not present

## 2021-11-26 ENCOUNTER — Other Ambulatory Visit (HOSPITAL_BASED_OUTPATIENT_CLINIC_OR_DEPARTMENT_OTHER): Payer: Self-pay

## 2021-11-26 MED ORDER — WEGOVY 0.5 MG/0.5ML ~~LOC~~ SOAJ
SUBCUTANEOUS | 1 refills | Status: DC
  Filled 2021-11-26: qty 2, 28d supply, fill #0
  Filled 2021-12-23: qty 2, 28d supply, fill #1

## 2021-12-23 ENCOUNTER — Other Ambulatory Visit (HOSPITAL_BASED_OUTPATIENT_CLINIC_OR_DEPARTMENT_OTHER): Payer: Self-pay

## 2022-01-12 ENCOUNTER — Ambulatory Visit: Payer: BC Managed Care – PPO | Admitting: Obstetrics and Gynecology

## 2022-01-12 ENCOUNTER — Other Ambulatory Visit (HOSPITAL_BASED_OUTPATIENT_CLINIC_OR_DEPARTMENT_OTHER): Payer: Self-pay

## 2022-01-12 ENCOUNTER — Encounter: Payer: Self-pay | Admitting: Obstetrics and Gynecology

## 2022-01-12 VITALS — BP 124/82 | HR 70 | Wt 241.6 lb

## 2022-01-12 DIAGNOSIS — N644 Mastodynia: Secondary | ICD-10-CM | POA: Diagnosis not present

## 2022-01-12 MED ORDER — WEGOVY 1 MG/0.5ML ~~LOC~~ SOAJ
1.0000 mg | SUBCUTANEOUS | 1 refills | Status: DC
  Filled 2022-01-12: qty 2, 28d supply, fill #0
  Filled 2022-02-02: qty 2, 28d supply, fill #1

## 2022-01-12 NOTE — Progress Notes (Signed)
GYNECOLOGY  VISIT   HPI: 51 y.o.   Divorced Black or [place] American Not Hispanic or Latino  female   [phone] with Patient's last menstrual period was 10/20/2003 (exact date).   here for right breast tenderness with implants (saline).    She had some tenderness in her right upper breast and a "squiggly line". The symptoms lasted x 3 days, now resolved. No increase in caffeine intake. No trauma.   Last mammogram was on 02/24/21.   GYNECOLOGIC HISTORY: Patient's last menstrual period was 10/20/2003 (exact date). Contraception:hysterectomy  Menopausal hormone therapy: none         OB History     Gravida  3   Para  1   Term  1   Preterm  0   AB  2   Living  1      SAB  2   IAB  0   Ectopic  0   Multiple  0   Live Births  1              Patient Active Problem List   Diagnosis Date Noted   S/P abdominal hysterectomy 06/27/2014   Thyroid nodule 06/27/2014   FH: colon cancer in relative diagnosed at >57 years old 06/27/2014    Past Medical History:  Diagnosis Date   Allergy    Fibroid 10/29/2003   hx of--removed with hyst.   Thyroid nodule 07/26/2008   both sides,  right thyroid biopsy X 2 07/2008 & 06/2013 benign   Viral meningitis 1990    Past Surgical History:  Procedure Laterality Date   ABDOMINAL HYSTERECTOMY  10-29-03   TAH d/t fibroids--Dr. Margaretha Glassing   AUGMENTATION MAMMAPLASTY Bilateral 05-02-2009   w/lift--Saline   BIOPSY THYROID Right 08/14/2008 & 07/11/2013   needle biopsy done on rt side - benign   BREAST BIOPSY Right 2003   benign    Current Outpatient Medications  Medication Sig Dispense Refill   Betamethasone Valerate 0.12 % foam 2 (two) times a week.     loratadine (CLARITIN) 10 MG tablet Take 10 mg by mouth as needed.      Multiple Vitamin (MULTIVITAMIN) capsule Take 1 capsule by mouth daily.     Semaglutide-Weight Management (WEGOVY) 0.5 MG/0.5ML SOAJ 0.5 mL Subcutaneous once a week 30 days 2 mL 1   triamcinolone cream (KENALOG)  0.1 % SMARTSIG:Sparingly Topical Twice Daily     Semaglutide-Weight Management (WEGOVY) 1 MG/0.5ML SOAJ Inject 1 mg into the skin once a week. (Patient not taking: Reported on 01/12/2022) 2 mL 1   No current facility-administered medications for this visit.     ALLERGIES: Patient has no known allergies.  Family History  Problem Relation Age of Onset   Hypertension Mother    Diabetes Mother    Diabetes Father    Hypertension Father    Hypertension Maternal Grandmother    Hypertension Maternal Grandfather    Prostate cancer Maternal Grandfather    Lung cancer Paternal Grandfather        smoker   Hypertension Paternal Grandfather    Kidney Stones Sister    Hypertension Paternal Grandmother    Colon cancer Paternal Aunt        dx'd age 24-died age 46   Colon cancer Paternal Aunt        dx'd 54 stage IV   Colon cancer Paternal Aunt        dx'd late 52's, died age 71   Breast cancer Maternal Aunt 42  bilateral mastectomy, no BRCA testing   Breast cancer Cousin     Social History   Socioeconomic History   Marital status: Divorced    Spouse name: Not on file   Number of children: Not on file   Years of education: Not on file   Highest education level: Not on file  Occupational History   Not on file  Tobacco Use   Smoking status: Never   Smokeless tobacco: Never  Vaping Use   Vaping Use: Never used  Substance and Sexual Activity   Alcohol use: No   Drug use: No   Sexual activity: Yes    Partners: Male    Birth control/protection: Surgical    Comment: TAH--still has ovaries  Other Topics Concern   Not on file  Social History Narrative   Lives alone. Work at [employer] and does some international travel.   Social Determinants of Health   Financial Resource Strain: Not on file  Food Insecurity: Not on file  Transportation Needs: Not on file  Physical Activity: Not on file  Stress: Not on file  Social Connections: Not on file  Intimate Partner Violence: Not on  file    Review of Systems  All other systems reviewed and are negative.   PHYSICAL EXAMINATION:    BP 124/82   Pulse 70   Wt 241 lb 9.6 oz (109.6 kg)   LMP 10/20/2003 (Exact Date)   SpO2 99%   BMI 41.47 kg/m     General appearance: alert, cooperative and appears stated age Breasts: normal appearance, no masses or tenderness, bilaterally soft implants . Patient examined sitting and supine.  No axillary adenopathy.   1. Breast tenderness Resolved, normal exam Call with recurrent pain Mammogram due in 10/23, she will schedule

## 2022-01-12 NOTE — Patient Instructions (Signed)
Breast Tenderness Breast tenderness is a common problem for women of all ages, but may also occur in men. Breast tenderness has many possible causes, including hormone changes, infections, taking certain medicines, and caffeine intake. In women, the pain usually comes and goes with the menstrual cycle, but it can also be constant. Breast tenderness may range from mild discomfort to severe pain. You may have tests, such as a mammogram or an ultrasound, to check for any unusual findings. Having breast tenderness usually does not mean that you have breast cancer. Follow these instructions at home: Managing pain and discomfort  If directed, put ice on the painful area. To do this: Put ice in a plastic bag. Place a towel between your skin and the bag. Leave the ice on for 20 minutes, 2-3 times a day. If your skin turns bright red, remove the ice right away to prevent skin damage. The risk of skin damage is higher if you cannot feel pain, heat, or cold. Wear a supportive bra or chest support: During exercise. While sleeping, if your breasts are very tender. Medicines Take over-the-counter and prescription medicines only as told by your health care provider. If the cause of your pain is an infection, you may be prescribed an antibiotic medicine. If you were prescribed antibiotics, take them as told by your health care provider. Do not stop using the antibiotic even if you start to feel better. Eating and drinking Decrease the amount of caffeine in your diet. Instead, drink more water and choose caffeine-free drinks. Your health care provider may recommend that you lessen the amount of fat in your diet. You can do this by: Limiting fried foods. Cooking foods using methods such as baking, boiling, grilling, and broiling. General instructions  Keep a log of the days and times when your breasts are most tender. Ask your health care provider how to do breast exams at home. This will help you notice if  you have an unusual growth or lump. Keep all follow-up visits. Contact a health care provider if: Any part of your breast is hard, red, and hot to the touch. This may be a sign of infection. You are a woman and have a new or painful lump in your breast that remains after your menstrual period ends. You are not breastfeeding and you have fluid, especially blood or pus, coming out of your nipples. You have a fever. Your pain does not improve or it gets worse. Your pain is interfering with your daily activities. Summary Breast tenderness may range from mild discomfort to severe pain. Breast tenderness has many possible causes, including hormone changes, infections, taking certain medicines, and caffeine intake. It can be treated with ice, wearing a supportive bra or chest support, and medicines. Make changes to your diet as told by your health care provider. This information is not intended to replace advice given to you by your health care provider. Make sure you discuss any questions you have with your health care provider. Document Revised: 07/15/2021 Document Reviewed: 07/15/2021 Elsevier Patient Education  2023 Elsevier Inc.  

## 2022-01-27 ENCOUNTER — Other Ambulatory Visit: Payer: Self-pay | Admitting: Family Medicine

## 2022-01-27 DIAGNOSIS — Z1231 Encounter for screening mammogram for malignant neoplasm of breast: Secondary | ICD-10-CM

## 2022-02-03 ENCOUNTER — Other Ambulatory Visit (HOSPITAL_BASED_OUTPATIENT_CLINIC_OR_DEPARTMENT_OTHER): Payer: Self-pay

## 2022-02-19 ENCOUNTER — Other Ambulatory Visit (HOSPITAL_BASED_OUTPATIENT_CLINIC_OR_DEPARTMENT_OTHER): Payer: Self-pay

## 2022-02-19 MED ORDER — WEGOVY 1.7 MG/0.75ML ~~LOC~~ SOAJ
1.7000 mg | SUBCUTANEOUS | 1 refills | Status: DC
  Filled 2022-02-19: qty 3, 28d supply, fill #0
  Filled 2022-03-17: qty 3, 28d supply, fill #1

## 2022-03-01 ENCOUNTER — Ambulatory Visit
Admission: RE | Admit: 2022-03-01 | Discharge: 2022-03-01 | Disposition: A | Discharge status: Home / Self Care | Payer: BC Managed Care – PPO | Source: Ambulatory Visit | Attending: Family Medicine | Admitting: Family Medicine

## 2022-03-01 ENCOUNTER — Other Ambulatory Visit: Payer: Self-pay | Admitting: Family Medicine

## 2022-03-01 DIAGNOSIS — Z1231 Encounter for screening mammogram for malignant neoplasm of breast: Secondary | ICD-10-CM | POA: Diagnosis not present

## 2022-03-17 ENCOUNTER — Other Ambulatory Visit (HOSPITAL_BASED_OUTPATIENT_CLINIC_OR_DEPARTMENT_OTHER): Payer: Self-pay

## 2022-03-17 NOTE — Progress Notes (Signed)
51 y.o. [id number] Divorced Black or African American Not Hispanic or Latino female here for annual exam.  H/O TAH in 6/05 for fibroids.  Sexually active, same long term partner. No dyspareunia. She has some vaginal dryness.  Occasionally hot at night, mild and tolerable. Sleeping okay.   No bowel or bladder c/o.  She has saline implants, placed in 2012. Not bothering her, asking about need for replacement.     Patient's last menstrual period was 10/20/2003 (exact date).          Sexually active: yes The current method of family planning is status post hysterectomy.    Exercising: No.  The patient does not participate in regular exercise at present. Smoker:  no  Health Maintenance: Pap:   09-17-03 neg  History of abnormal Pap:  no MMG:  03/02/22 Bi-rads 1 neg  BMD:   n/a Colonoscopy: 05/29/18 normal f/u 5 years  TDaP:  2018 Gardasil: n/a   reports that she has never smoked. She has never used smokeless tobacco. She reports that she does not drink alcohol and does not use drugs.  She works in Engineer, technical sales. Daughter and grandchildren (4 and 2) are local.   Past Medical History:  Diagnosis Date   Allergy    Fibroid 10/29/2003   hx of--removed with hyst.   Thyroid nodule 07/26/2008   both sides,  right thyroid biopsy X 2 07/2008 & 06/2013 benign   Viral meningitis 1990    Past Surgical History:  Procedure Laterality Date   ABDOMINAL HYSTERECTOMY  10-29-03   TAH d/t fibroids--Dr. Margaretha Glassing   AUGMENTATION MAMMAPLASTY Bilateral 05-02-2009   w/lift--Saline   BIOPSY THYROID Right 08/14/2008 & 07/11/2013   needle biopsy done on rt side - benign   BREAST BIOPSY Right 2003   benign    Current Outpatient Medications  Medication Sig Dispense Refill   Betamethasone Valerate 0.12 % foam 2 (two) times a week.     loratadine (CLARITIN) 10 MG tablet Take 10 mg by mouth as needed.      Multiple Vitamin (MULTIVITAMIN) capsule Take 1 capsule by mouth daily.     Semaglutide-Weight Management (WEGOVY) 1.7  MG/0.75ML SOAJ Inject 1.7 mg into the skin once a week. 3 mL 1   Semaglutide-Weight Management (WEGOVY) 0.5 MG/0.5ML SOAJ 0.5 mL Subcutaneous once a week 30 days (Patient not taking: Reported on 03/24/2022) 2 mL 1   Semaglutide-Weight Management (WEGOVY) 1 MG/0.5ML SOAJ Inject 1 mg into the skin once a week. (Patient not taking: Reported on 01/12/2022) 2 mL 1   No current facility-administered medications for this visit.    Family History  Problem Relation Age of Onset   Hypertension Mother    Diabetes Mother    Diabetes Father    Hypertension Father    Hypertension Maternal Grandmother    Hypertension Maternal Grandfather    Prostate cancer Maternal Grandfather    Lung cancer Paternal Grandfather        smoker   Hypertension Paternal Grandfather    Kidney Stones Sister    Hypertension Paternal Grandmother    Colon cancer Paternal Aunt        dx'd age 72-died age 63   Colon cancer Paternal Aunt        dx'd 46 stage IV   Colon cancer Paternal Aunt        dx'd late 53's, died age 40   Breast cancer Maternal Aunt 54       bilateral mastectomy, no BRCA testing  Breast cancer Cousin     Review of Systems  All other systems reviewed and are negative.   Exam:   BP 110/72   Pulse 62   Ht _0  (1.626 m)   Wt 236 lb (107 kg)   LMP 10/20/2003 (Exact Date)   SpO2 98%   BMI 40.51 kg/m   Weight change: _1 @ Height:   Height: _2  (162.6 cm)  Ht Readings from Last 3 Encounters:  03/24/22 _3  (1.626 m)  03/23/21 _4  (1.626 m)  03/18/20 5' 3.75" (1.619 m)    General appearance: alert, cooperative and appears stated age Head: Normocephalic, without obvious abnormality, atraumatic Neck: no adenopathy, supple, symmetrical, trachea midline and thyroid normal to inspection and palpation Lungs: clear to auscultation bilaterally Cardiovascular: regular rate and rhythm Breasts: normal appearance, no masses or tenderness, bilaterally soft implants Abdomen: soft,  non-tender; non distended,  no masses,  no organomegaly Extremities: extremities normal, atraumatic, no cyanosis or edema Skin: Skin color, texture, turgor normal. No rashes or lesions Lymph nodes: Cervical, supraclavicular, and axillary nodes normal. No abnormal inguinal nodes palpated Neurologic: Grossly normal   Pelvic: External genitalia:  no lesions              Urethra:  normal appearing urethra with no masses, tenderness or lesions              Bartholins and Skenes: normal                 Vagina: normal appearing vagina with normal color and discharge, no lesions. Not atrophic appearing              Cervix: absent               Bimanual Exam:  Uterus:  uterus absent              Adnexa: no mass, fullness, tenderness               Rectovaginal: Confirms               Anus:  normal sphincter tone, no lesions  Gae Dry, CMA chaperoned for the exam.  1. Well woman exam Discussed breast self exam Discussed calcium and vit D intake No pap needed Mammogram and colonoscopy UTD No pap needed Her implants are saline and aren't causing her problems. I don't think she needs them removed at this time, offered referral to a plastic surgeon Given samples of uberlube for vaginal lubrication

## 2022-03-20 IMAGING — MG DIGITAL SCREENING BREAST BILAT IMPLANT W/ TOMO W/ CAD
8 of 12 series · 8 of 28 positions shown · non-contrast
Comparison: Previous exam(s).

ACR Breast Density Category a: The breast tissue is almost entirely
fatty.

CLINICAL DATA: Screening.

EXAM:
DIGITAL SCREENING BILATERAL MAMMOGRAM WITH IMPLANTS, CAD AND
TOMOSYNTHESIS
TECHNIQUE: Bilateral screening digital craniocaudal and mediolateral oblique
mammograms were obtained. Bilateral screening digital breast
tomosynthesis was performed. The images were evaluated with
computer-aided detection. Standard and/or implant displaced views
were performed.

[R MLO]
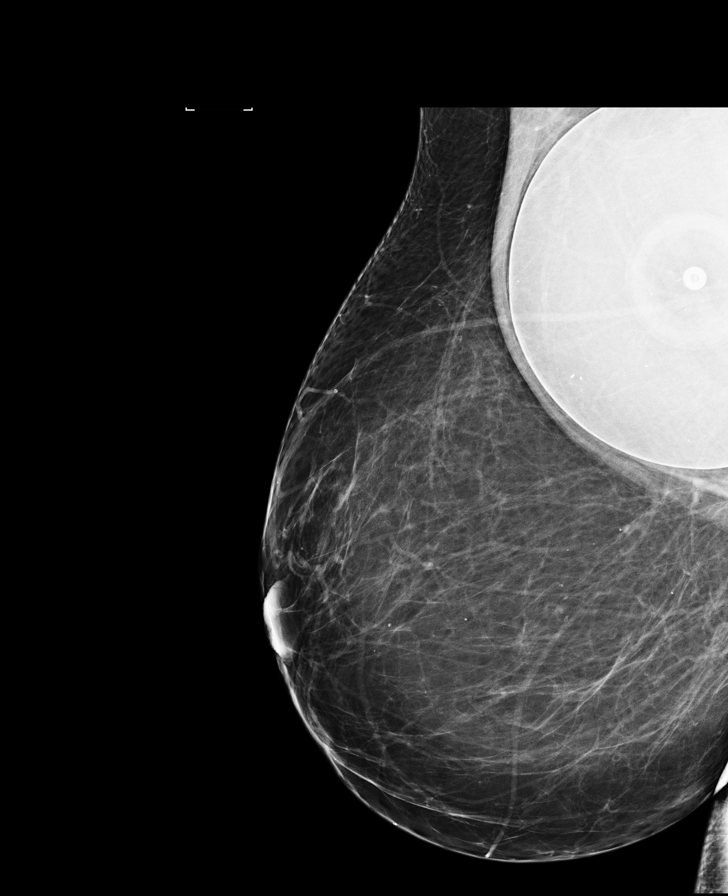

[L CC]
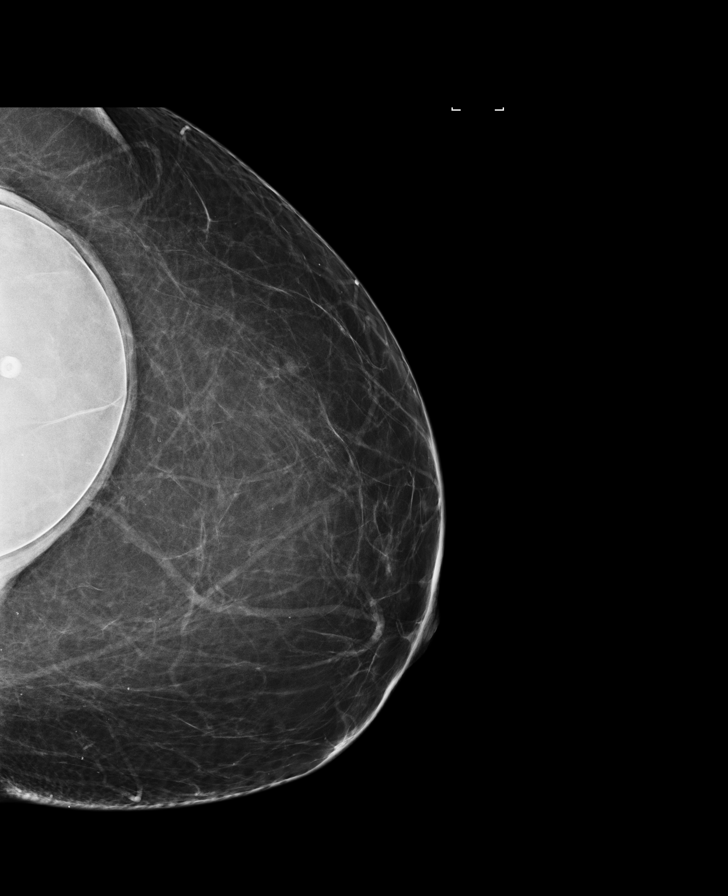

[L MLO]
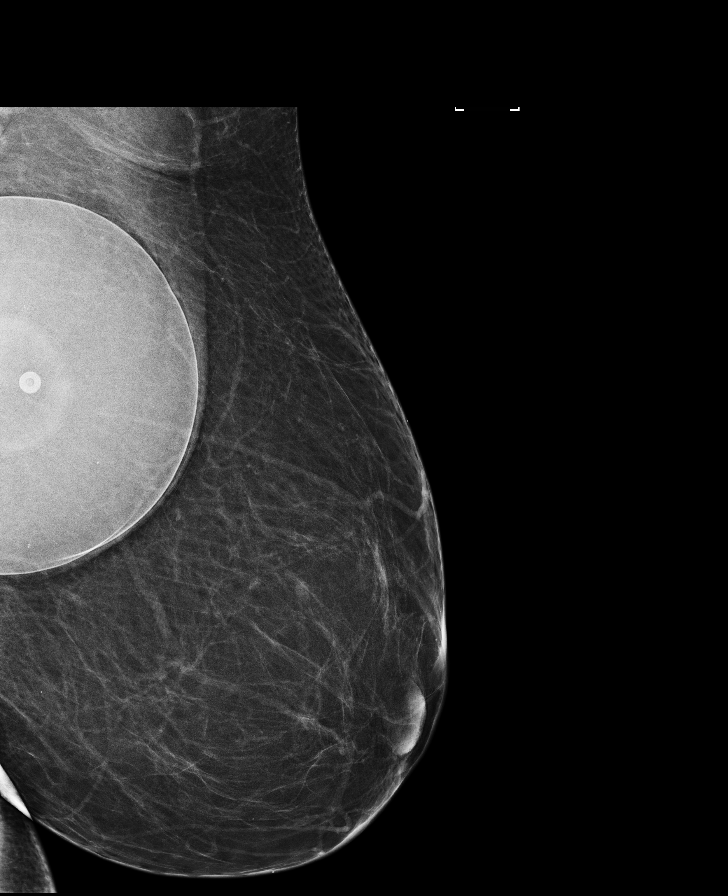

[R CC]
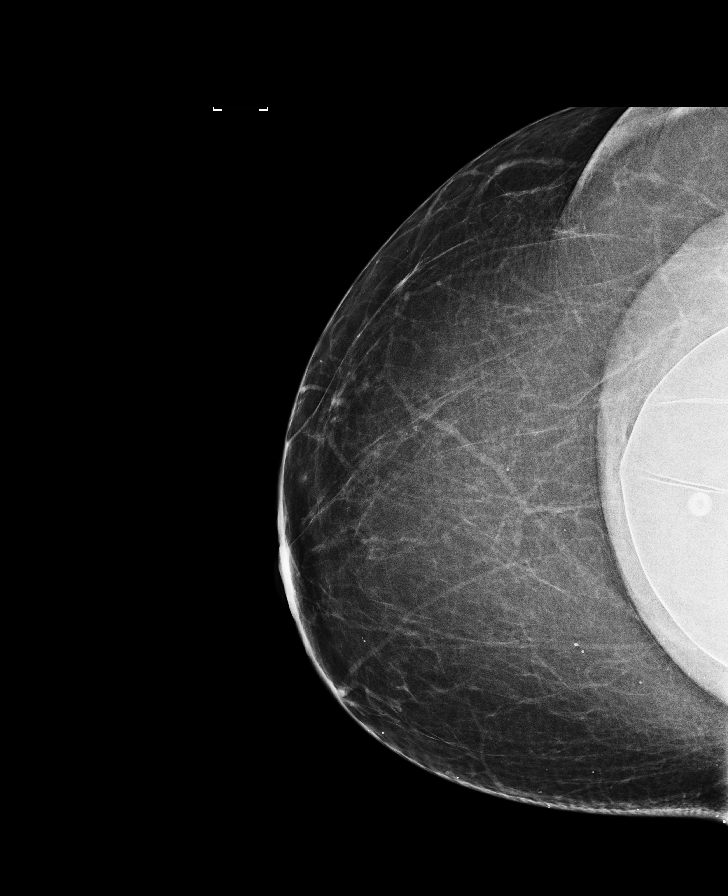

[R CC synth-2D]
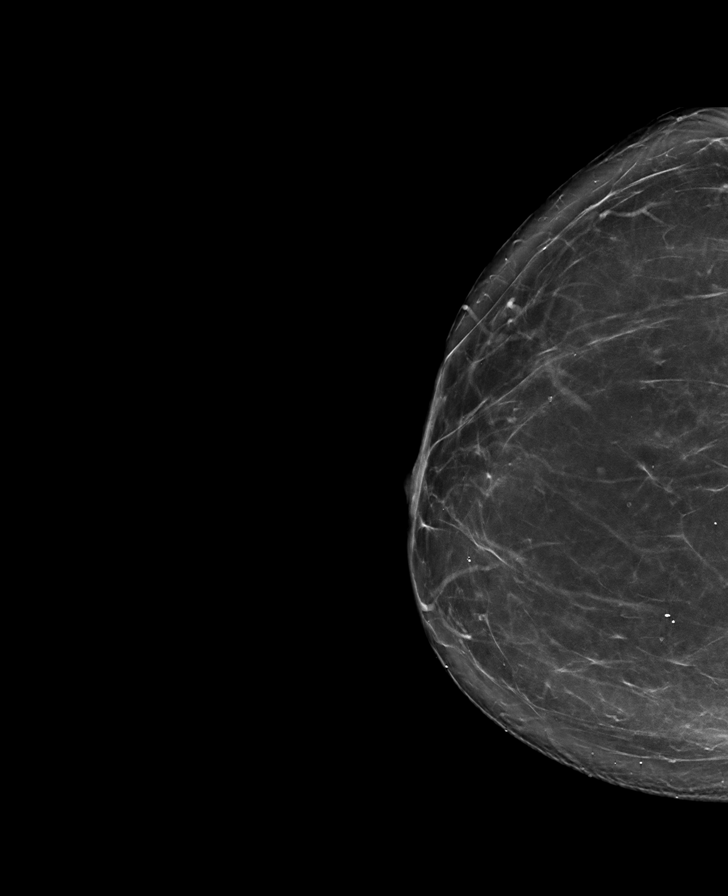

[L MLO synth-2D]
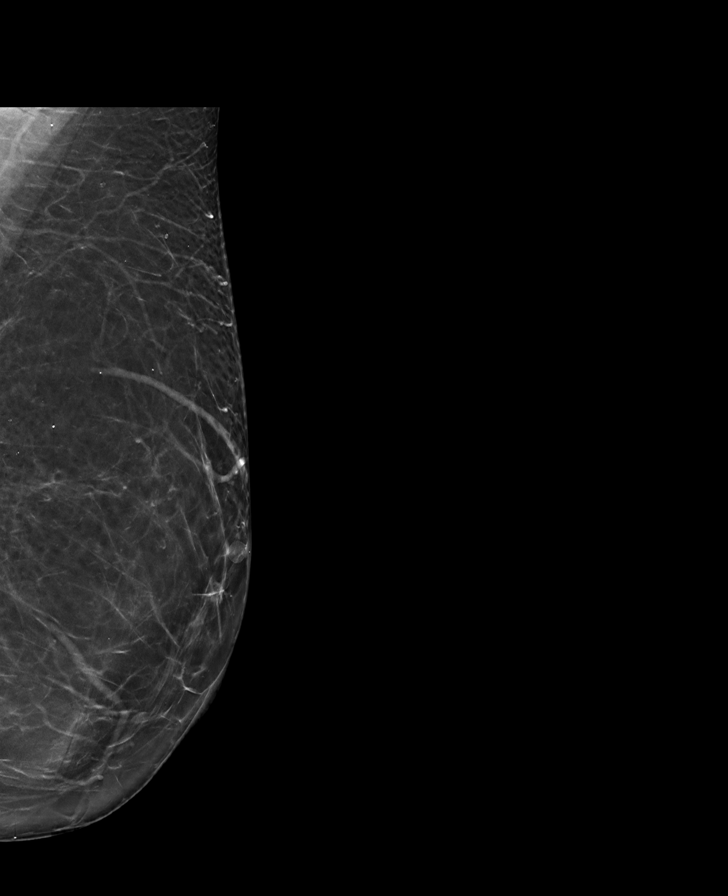

[R MLO synth-2D]
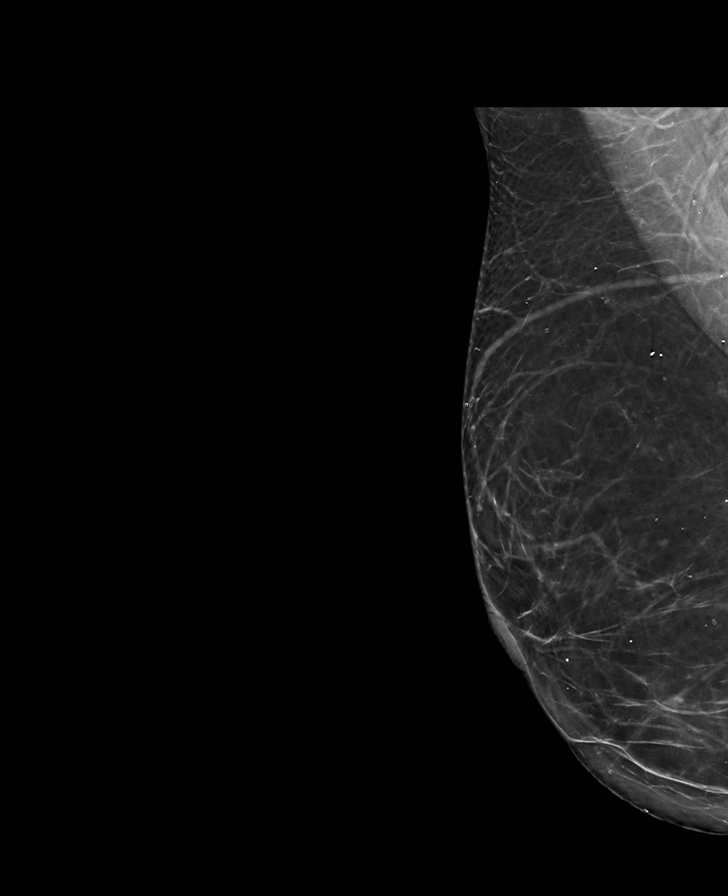

[L CC synth-2D]
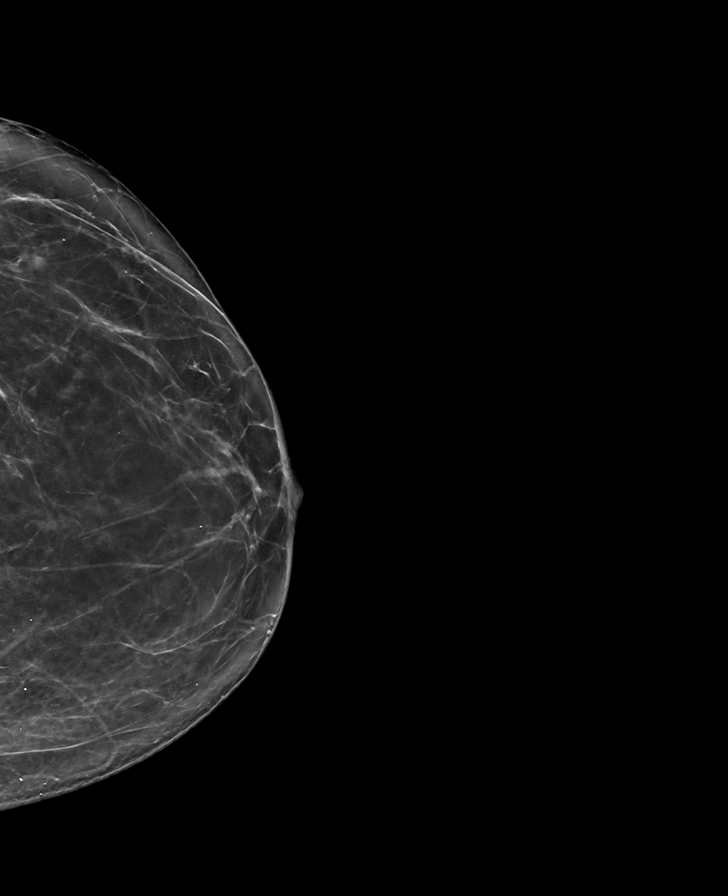

[8 of 28 positions shown; findings below may reference images not displayed]

FINDINGS: The patient has retropectoral implants. There are no findings
suspicious for malignancy.
IMPRESSION: No mammographic evidence of malignancy. A result letter of this
screening mammogram will be mailed directly to the patient.

RECOMMENDATION:
Screening mammogram in one year. (Code:[id number]-T-P7A)

BI-RADS CATEGORY  1:  Negative.

## 2022-03-24 ENCOUNTER — Ambulatory Visit (INDEPENDENT_AMBULATORY_CARE_PROVIDER_SITE_OTHER): Payer: BC Managed Care – PPO | Admitting: Obstetrics and Gynecology

## 2022-03-24 ENCOUNTER — Encounter: Payer: Self-pay | Admitting: Obstetrics and Gynecology

## 2022-03-24 VITALS — BP 110/72 | HR 62 | Ht 64.0 in | Wt 236.0 lb

## 2022-03-24 DIAGNOSIS — R7402 Elevation of levels of lactic acid dehydrogenase (LDH): Secondary | ICD-10-CM | POA: Insufficient documentation

## 2022-03-24 DIAGNOSIS — K594 Anal spasm: Secondary | ICD-10-CM | POA: Insufficient documentation

## 2022-03-24 DIAGNOSIS — Z01419 Encounter for gynecological examination (general) (routine) without abnormal findings: Secondary | ICD-10-CM | POA: Diagnosis not present

## 2022-03-24 DIAGNOSIS — Z83719 Family history of colon polyps, unspecified: Secondary | ICD-10-CM | POA: Insufficient documentation

## 2022-03-24 DIAGNOSIS — Z800 Family history of malignant neoplasm of digestive organs: Secondary | ICD-10-CM | POA: Insufficient documentation

## 2022-03-24 DIAGNOSIS — Z1211 Encounter for screening for malignant neoplasm of colon: Secondary | ICD-10-CM | POA: Insufficient documentation

## 2022-03-24 NOTE — Patient Instructions (Signed)

## 2022-04-06 ENCOUNTER — Other Ambulatory Visit (HOSPITAL_BASED_OUTPATIENT_CLINIC_OR_DEPARTMENT_OTHER): Payer: Self-pay

## 2022-04-06 MED ORDER — WEGOVY 2.4 MG/0.75ML ~~LOC~~ SOAJ
2.4000 mg | SUBCUTANEOUS | 8 refills | Status: DC
  Filled 2022-04-06: qty 3, 28d supply, fill #0
  Filled 2022-05-04: qty 3, 28d supply, fill #1
  Filled 2022-06-01: qty 3, 28d supply, fill #2

## 2022-05-19 ENCOUNTER — Other Ambulatory Visit (HOSPITAL_BASED_OUTPATIENT_CLINIC_OR_DEPARTMENT_OTHER): Payer: Self-pay

## 2022-05-19 DIAGNOSIS — J019 Acute sinusitis, unspecified: Secondary | ICD-10-CM | POA: Diagnosis not present

## 2022-05-19 DIAGNOSIS — R051 Acute cough: Secondary | ICD-10-CM | POA: Diagnosis not present

## 2022-05-19 DIAGNOSIS — Z03818 Encounter for observation for suspected exposure to other biological agents ruled out: Secondary | ICD-10-CM | POA: Diagnosis not present

## 2022-05-19 DIAGNOSIS — J069 Acute upper respiratory infection, unspecified: Secondary | ICD-10-CM | POA: Diagnosis not present

## 2022-05-19 MED ORDER — AMOXICILLIN 875 MG PO TABS
875.0000 mg | ORAL_TABLET | Freq: Two times a day (BID) | ORAL | 0 refills | Status: DC
  Filled 2022-05-19: qty 20, 10d supply, fill #0

## 2022-06-01 ENCOUNTER — Other Ambulatory Visit (HOSPITAL_BASED_OUTPATIENT_CLINIC_OR_DEPARTMENT_OTHER): Payer: Self-pay

## 2022-06-02 ENCOUNTER — Other Ambulatory Visit (HOSPITAL_BASED_OUTPATIENT_CLINIC_OR_DEPARTMENT_OTHER): Payer: Self-pay

## 2022-06-02 ENCOUNTER — Encounter (HOSPITAL_BASED_OUTPATIENT_CLINIC_OR_DEPARTMENT_OTHER): Payer: Self-pay

## 2022-06-02 ENCOUNTER — Other Ambulatory Visit: Payer: Self-pay

## 2022-06-04 ENCOUNTER — Other Ambulatory Visit (HOSPITAL_BASED_OUTPATIENT_CLINIC_OR_DEPARTMENT_OTHER): Payer: Self-pay

## 2022-07-13 ENCOUNTER — Other Ambulatory Visit (HOSPITAL_BASED_OUTPATIENT_CLINIC_OR_DEPARTMENT_OTHER): Payer: Self-pay

## 2022-07-13 MED ORDER — VALACYCLOVIR HCL 1 G PO TABS
2.0000 g | ORAL_TABLET | Freq: Two times a day (BID) | ORAL | 0 refills | Status: AC
  Filled 2022-07-13: qty 30, 7d supply, fill #0

## 2022-07-13 MED ORDER — BETAMETHASONE VALERATE 0.12 % EX FOAM
CUTANEOUS | 11 refills | Status: AC
  Filled 2022-07-13 – 2022-08-18 (×2): qty 50, 30d supply, fill #0
  Filled 2022-11-26: qty 50, 30d supply, fill #1
  Filled 2023-01-20: qty 50, 30d supply, fill #2
  Filled 2023-02-23: qty 50, 30d supply, fill #3
  Filled 2023-04-01: qty 50, 30d supply, fill #4
  Filled 2023-05-03: qty 50, 30d supply, fill #5
  Filled 2023-06-05: qty 50, 30d supply, fill #6
  Filled 2023-07-09: qty 50, 30d supply, fill #7

## 2022-07-14 ENCOUNTER — Encounter (HOSPITAL_BASED_OUTPATIENT_CLINIC_OR_DEPARTMENT_OTHER): Payer: Self-pay

## 2022-07-14 ENCOUNTER — Other Ambulatory Visit (HOSPITAL_BASED_OUTPATIENT_CLINIC_OR_DEPARTMENT_OTHER): Payer: Self-pay

## 2022-07-29 ENCOUNTER — Other Ambulatory Visit (HOSPITAL_BASED_OUTPATIENT_CLINIC_OR_DEPARTMENT_OTHER): Payer: Self-pay

## 2022-08-03 ENCOUNTER — Other Ambulatory Visit: Payer: Self-pay

## 2022-08-05 ENCOUNTER — Other Ambulatory Visit (HOSPITAL_BASED_OUTPATIENT_CLINIC_OR_DEPARTMENT_OTHER): Payer: Self-pay

## 2022-08-05 ENCOUNTER — Other Ambulatory Visit: Payer: Self-pay

## 2022-08-18 ENCOUNTER — Other Ambulatory Visit (HOSPITAL_BASED_OUTPATIENT_CLINIC_OR_DEPARTMENT_OTHER): Payer: Self-pay

## 2022-08-19 ENCOUNTER — Other Ambulatory Visit (HOSPITAL_BASED_OUTPATIENT_CLINIC_OR_DEPARTMENT_OTHER): Payer: Self-pay

## 2022-11-16 ENCOUNTER — Other Ambulatory Visit (HOSPITAL_BASED_OUTPATIENT_CLINIC_OR_DEPARTMENT_OTHER): Payer: Self-pay

## 2022-11-16 DIAGNOSIS — Z0000 Encounter for general adult medical examination without abnormal findings: Secondary | ICD-10-CM | POA: Diagnosis not present

## 2022-11-16 DIAGNOSIS — Z13220 Encounter for screening for lipoid disorders: Secondary | ICD-10-CM | POA: Diagnosis not present

## 2022-11-16 DIAGNOSIS — E042 Nontoxic multinodular goiter: Secondary | ICD-10-CM | POA: Diagnosis not present

## 2022-11-16 MED ORDER — VALACYCLOVIR HCL 1 G PO TABS
ORAL_TABLET | ORAL | 0 refills | Status: AC
  Filled 2022-11-16: qty 30, 30d supply, fill #0
  Filled 2023-11-05 (×2): qty 30, 27d supply, fill #1

## 2022-11-17 ENCOUNTER — Other Ambulatory Visit (HOSPITAL_BASED_OUTPATIENT_CLINIC_OR_DEPARTMENT_OTHER): Payer: Self-pay

## 2022-11-26 ENCOUNTER — Other Ambulatory Visit: Payer: Self-pay

## 2022-11-29 ENCOUNTER — Other Ambulatory Visit (HOSPITAL_BASED_OUTPATIENT_CLINIC_OR_DEPARTMENT_OTHER): Payer: Self-pay

## 2023-01-21 ENCOUNTER — Other Ambulatory Visit: Payer: Self-pay

## 2023-01-24 ENCOUNTER — Other Ambulatory Visit (HOSPITAL_BASED_OUTPATIENT_CLINIC_OR_DEPARTMENT_OTHER): Payer: Self-pay

## 2023-03-11 ENCOUNTER — Other Ambulatory Visit: Payer: Self-pay | Admitting: Family Medicine

## 2023-03-11 DIAGNOSIS — Z1231 Encounter for screening mammogram for malignant neoplasm of breast: Secondary | ICD-10-CM

## 2023-03-28 ENCOUNTER — Ambulatory Visit: Payer: BC Managed Care – PPO | Admitting: Obstetrics and Gynecology

## 2023-04-04 ENCOUNTER — Other Ambulatory Visit: Payer: Self-pay

## 2023-04-04 ENCOUNTER — Other Ambulatory Visit (HOSPITAL_BASED_OUTPATIENT_CLINIC_OR_DEPARTMENT_OTHER): Payer: Self-pay

## 2023-04-05 ENCOUNTER — Other Ambulatory Visit: Payer: Self-pay

## 2023-04-07 ENCOUNTER — Other Ambulatory Visit: Payer: Self-pay | Admitting: Family Medicine

## 2023-04-07 ENCOUNTER — Ambulatory Visit
Admission: RE | Admit: 2023-04-07 | Discharge: 2023-04-07 | Disposition: A | Discharge status: Home / Self Care | Payer: BC Managed Care – PPO | Source: Ambulatory Visit | Attending: Family Medicine | Admitting: Family Medicine

## 2023-04-07 DIAGNOSIS — Z1231 Encounter for screening mammogram for malignant neoplasm of breast: Secondary | ICD-10-CM | POA: Diagnosis not present

## 2023-04-21 ENCOUNTER — Encounter: Payer: Self-pay | Admitting: Obstetrics and Gynecology

## 2023-04-21 ENCOUNTER — Ambulatory Visit: Payer: BC Managed Care – PPO | Admitting: Obstetrics and Gynecology

## 2023-04-21 ENCOUNTER — Other Ambulatory Visit (HOSPITAL_BASED_OUTPATIENT_CLINIC_OR_DEPARTMENT_OTHER): Payer: Self-pay

## 2023-04-21 VITALS — BP 126/62 | HR 111 | Ht 64.0 in | Wt 229.0 lb

## 2023-04-21 DIAGNOSIS — Z90710 Acquired absence of both cervix and uterus: Secondary | ICD-10-CM

## 2023-04-21 DIAGNOSIS — E040 Nontoxic diffuse goiter: Secondary | ICD-10-CM

## 2023-04-21 DIAGNOSIS — Z01419 Encounter for gynecological examination (general) (routine) without abnormal findings: Secondary | ICD-10-CM

## 2023-04-21 DIAGNOSIS — N958 Other specified menopausal and perimenopausal disorders: Secondary | ICD-10-CM | POA: Diagnosis not present

## 2023-04-21 MED ORDER — ESTRADIOL 0.1 MG/GM VA CREA
TOPICAL_CREAM | VAGINAL | 1 refills | Status: AC
  Filled 2023-04-21: qty 42.5, 30d supply, fill #0

## 2023-04-21 NOTE — Assessment & Plan Note (Signed)
Reviewed safety profile of low dose vaginal estrogen, however reviewed that higher doses have been associated with DVT, breast and uterine cancer.   Trial for symptoms.

## 2023-04-21 NOTE — Assessment & Plan Note (Signed)
Encouraged healthy diet and exercise

## 2023-04-21 NOTE — Patient Instructions (Signed)
For patients under 50-53yo, I recommend 1200mg  calcium daily and 600IU of vitamin D daily. For patients over 53yo, I recommend 1200mg  calcium daily and 800IU of vitamin D daily.  Health Maintenance, Female Adopting a healthy lifestyle and getting preventive care are important in promoting health and wellness. Ask your health care provider about: The right schedule for you to have regular tests and exams. Things you can do on your own to prevent diseases and keep yourself healthy. What should I know about diet, weight, and exercise? Eat a healthy diet  Eat a diet that includes plenty of vegetables, fruits, low-fat dairy products, and lean protein. Do not eat a lot of foods that are high in solid fats, added sugars, or sodium. Maintain a healthy weight Body mass index (BMI) is used to identify weight problems. It estimates body fat based on height and weight. Your health care provider can help determine your BMI and help you achieve or maintain a healthy weight. Get regular exercise Get regular exercise. This is one of the most important things you can do for your health. Most adults should: Exercise for at least 150 minutes each week. The exercise should increase your heart rate and make you sweat (moderate-intensity exercise). Do strengthening exercises at least twice a week. This is in addition to the moderate-intensity exercise. Spend less time sitting. Even light physical activity can be beneficial. Watch cholesterol and blood lipids Have your blood tested for lipids and cholesterol at 53 years of age, then have this test every 5 years. Have your cholesterol levels checked more often if: Your lipid or cholesterol levels are high. You are older than 53 years of age. You are at high risk for heart disease. What should I know about cancer screening? Depending on your health history and family history, you may need to have cancer screening at various ages. This may include screening  for: Breast cancer. Cervical cancer. Colorectal cancer. Skin cancer. Lung cancer. What should I know about heart disease, diabetes, and high blood pressure? Blood pressure and heart disease High blood pressure causes heart disease and increases the risk of stroke. This is more likely to develop in people who have high blood pressure readings or are overweight. Have your blood pressure checked: Every 3-5 years if you are 83-53 years of age. Every year if you are 4 years old or older. Diabetes Have regular diabetes screenings. This checks your fasting blood sugar level. Have the screening done: Once every three years after age 68 if you are at a normal weight and have a low risk for diabetes. More often and at a younger age if you are overweight or have a high risk for diabetes. What should I know about preventing infection? Hepatitis B If you have a higher risk for hepatitis B, you should be screened for this virus. Talk with your health care provider to find out if you are at risk for hepatitis B infection. Hepatitis C Testing is recommended for: Everyone born from [birth year] through 1965. Anyone with known risk factors for hepatitis C. Sexually transmitted infections (STIs) Get screened for STIs, including gonorrhea and chlamydia, if: You are sexually active and are younger than 53 years of age. You are older than 53 years of age and your health care provider tells you that you are at risk for this type of infection. Your sexual activity has changed since you were last screened, and you are at increased risk for chlamydia or gonorrhea. Ask your health care provider if  you are at risk. Ask your health care provider about whether you are at high risk for HIV. Your health care provider may recommend a prescription medicine to help prevent HIV infection. If you choose to take medicine to prevent HIV, you should first get tested for HIV. You should then be tested every 3 months for as long as you  are taking the medicine. Osteoporosis and menopause Osteoporosis is a disease in which the bones lose minerals and strength with aging. This can result in bone fractures. If you are 44 years old or older, or if you are at risk for osteoporosis and fractures, ask your health care provider if you should: Be screened for bone loss. Take a calcium or vitamin D supplement to lower your risk of fractures. Be given hormone replacement therapy (HRT) to treat symptoms of menopause. Follow these instructions at home: Alcohol use Do not drink alcohol if: Your health care provider tells you not to drink. You are pregnant, may be pregnant, or are planning to become pregnant. If you drink alcohol: Limit how much you have to: 0-1 drink a day. Know how much alcohol is in your drink. In the [place] one drink equals one 12 oz bottle of beer (355 mL), one 5 oz glass of wine (148 mL), or one 1 oz glass of hard liquor (44 mL). Lifestyle Do not use any products that contain nicotine or tobacco. These products include cigarettes, chewing tobacco, and vaping devices, such as e-cigarettes. If you need help quitting, ask your health care provider. Do not use street drugs. Do not share needles. Ask your health care provider for help if you need support or information about quitting drugs. General instructions Schedule regular health, dental, and eye exams. Stay current with your vaccines. Tell your health care provider if: You often feel depressed. You have ever been abused or do not feel safe at home. Summary Adopting a healthy lifestyle and getting preventive care are important in promoting health and wellness. Follow your health care provider's instructions about healthy diet, exercising, and getting tested or screened for diseases. Follow your health care provider's instructions on monitoring your cholesterol and blood pressure. This information is not intended to replace advice given to you by your health  care provider. Make sure you discuss any questions you have with your health care provider. Document Revised: 09/22/2020 Document Reviewed: 09/22/2020 Elsevier Patient Education  2024 [employer].

## 2023-04-21 NOTE — Assessment & Plan Note (Signed)
 Cervical cancer screening performed according to ASCCP guidelines. Encouraged annual mammogram screening Colonoscopy UTD DXA N/A Labs and immunizations with her primary Encouraged safe sexual practices as indicated Encouraged healthy lifestyle practices with diet and exercise For patients under 50-53yo, I recommend 1200mg  calcium daily and 600IU of vitamin D daily.

## 2023-04-21 NOTE — Progress Notes (Addendum)
52 y.o. [id number] female s/p TAH (2005 for fibroids) with breast implants here for annual exam. Long term partner. Works in Consulting civil engineer. Pt c/o vaginal dryness.  Notes almost daily irritation and dryness with intercourse.  Patient's last menstrual period was 10/20/2003 (exact date).   Abnormal bleeding: None Pelvic discharge or pain: None  Breast mass, nipple discharge or skin changes : None Last PAP: not indicated Last mammogram: 04/11/23 BIRADS 1, density  Last colonoscopy: 05/29/18 normal f/u 5 years  Sexually active: yes  Exercising: no Smoker:  no   GYN HISTORY: Prior hysterectomy for fibroids  OB History  Gravida Para Term Preterm AB Living  3 1 1  0 2 1  SAB IAB Ectopic Multiple Live Births  2 0 0 0 1    # Outcome Date GA Lbr Len/2nd Weight Sex Type Anes PTL Lv  3 SAB           2 SAB           1 Term     F Vag-Spont   LIV    Past Medical History:  Diagnosis Date   Allergy    Fibroid 10/29/2003   hx of--removed with hyst.   Thyroid nodule 07/26/2008   both sides,  right thyroid biopsy X 2 07/2008 & 06/2013 benign   Viral meningitis 1990    Past Surgical History:  Procedure Laterality Date   ABDOMINAL HYSTERECTOMY  10-29-03   TAH d/t fibroids--Dr. Myrlene Broker   AUGMENTATION MAMMAPLASTY Bilateral 05-02-2009   w/lift--Saline   BIOPSY THYROID Right 08/14/2008 & 07/11/2013   needle biopsy done on rt side - benign   BREAST BIOPSY Right 2003   benign    Current Outpatient Medications on File Prior to Visit  Medication Sig Dispense Refill   Betamethasone Valerate 0.12 % foam Apply to affected area twice weekly as needed. 50 g 11   loratadine (CLARITIN) 10 MG tablet Take 10 mg by mouth as needed.      Multiple Vitamin (MULTIVITAMIN) capsule Take 1 capsule by mouth daily.     valACYclovir (VALTREX) 1000 MG tablet Take 2 tablets (2,000 mg total) by mouth every 12 (twelve) hours for 1 day as needed. 90 tablet 0   No current facility-administered medications on file prior to  visit.    Social History   Socioeconomic History   Marital status: Divorced    Spouse name: Not on file   Number of children: Not on file   Years of education: Not on file   Highest education level: Not on file  Occupational History   Not on file  Tobacco Use   Smoking status: Never   Smokeless tobacco: Never  Vaping Use   Vaping status: Never Used  Substance and Sexual Activity   Alcohol use: Yes    Comment: soccially   Drug use: No   Sexual activity: Yes    Partners: Male    Birth control/protection: Surgical    Comment: TAH--still has ovaries  Other Topics Concern   Not on file  Social History Narrative   Lives alone. Work at [employer] and does some international travel.   Social Determinants of Health   Financial Resource Strain: Not on file  Food Insecurity: Not on file  Transportation Needs: Not on file  Physical Activity: Not on file  Stress: Not on file  Social Connections: Not on file  Intimate Partner Violence: Not on file    Family History  Problem Relation Age of Onset  Hypertension Mother    Diabetes Mother    Colon cancer Father    Diabetes Father    Hypertension Father    Kidney Stones Sister    Breast cancer Maternal Aunt 45       bilateral mastectomy, no BRCA testing   Colon cancer Paternal Aunt        dx'd age 39-died age 55   Colon cancer Paternal Aunt        dx'd 2 stage IV   Colon cancer Paternal Aunt        dx'd late 78's, died age 37   Hypertension Maternal Grandmother    Hypertension Maternal Grandfather    Prostate cancer Maternal Grandfather    Hypertension Paternal Grandmother    Lung cancer Paternal Grandfather        smoker   Hypertension Paternal Grandfather    Breast cancer Cousin     No Known Allergies    PE Today's Vitals   04/21/23 1318  BP: 126/62  Pulse: (!) 111  SpO2: 98%  Weight: 229 lb (103.9 kg)  Height: 5\' 4"  (1.626 m)   Body mass index is 39.31 kg/m.  Physical Exam Vitals reviewed. Exam  conducted with a chaperone present.  Constitutional:      General: She is not in acute distress.    Appearance: Normal appearance.  HENT:     Head: Normocephalic and atraumatic.     Nose: Nose normal.  Eyes:     Extraocular Movements: Extraocular movements intact.     Conjunctiva/sclera: Conjunctivae normal.  Neck:     Thyroid: Thyromegaly present. No thyroid mass or thyroid tenderness.  Pulmonary:     Effort: Pulmonary effort is normal.  Chest:     Chest wall: No mass or tenderness.  Breasts:    Right: Normal. No swelling, mass, nipple discharge or tenderness.     Left: Normal. No swelling, mass, nipple discharge or tenderness.  Abdominal:     General: There is no distension.     Palpations: Abdomen is soft.     Tenderness: There is no abdominal tenderness.  Genitourinary:    General: Normal vulva.     Exam position: Lithotomy position.     Urethra: No prolapse.     Vagina: Normal. No vaginal discharge or bleeding.     Cervix: No lesion.     Adnexa: Right adnexa normal and left adnexa normal.     Comments: Cervix and uterus absent Musculoskeletal:        General: Normal range of motion.     Cervical back: Normal range of motion.  Lymphadenopathy:     Upper Body:     Right upper body: No axillary adenopathy.     Left upper body: No axillary adenopathy.     Lower Body: No right inguinal adenopathy. No left inguinal adenopathy.  Skin:    General: Skin is warm and dry.  Neurological:     General: No focal deficit present.     Mental Status: She is alert.  Psychiatric:        Mood and Affect: Mood normal.        Behavior: Behavior normal.       Assessment and Plan:        Well woman exam with routine gynecological exam Assessment & Plan: Cervical cancer screening performed according to ASCCP guidelines. Encouraged annual mammogram screening Colonoscopy UTD DXA N/A Labs and immunizations with her primary Encouraged safe sexual practices as indicated Encouraged  healthy lifestyle  practices with diet and exercise For patients under 50-70yo, I recommend 1200mg  calcium daily and 600IU of vitamin D daily.    S/P abdominal hysterectomy  Morbid obesity (HCC) Assessment & Plan: Encouraged healthy diet and exercise.    Genitourinary syndrome of menopause Assessment & Plan: Reviewed safety profile of low dose vaginal estrogen, however reviewed that higher doses have been associated with DVT, breast and uterine cancer.   Trial for symptoms.  Orders: -     Estradiol; Apply 1/2 gram to vulva nightly for 2 weeks then decrease to 1/2 gram to vulva two nights a week.  Dispense: 42.5 g; Refill: 1  Goiter diffuse  Followed by endocrine  Rosalyn Gess, MD

## 2023-06-06 ENCOUNTER — Other Ambulatory Visit: Payer: Self-pay

## 2023-06-28 DIAGNOSIS — Z1211 Encounter for screening for malignant neoplasm of colon: Secondary | ICD-10-CM | POA: Diagnosis not present

## 2023-06-28 DIAGNOSIS — Z800 Family history of malignant neoplasm of digestive organs: Secondary | ICD-10-CM | POA: Diagnosis not present

## 2023-07-11 ENCOUNTER — Other Ambulatory Visit: Payer: Self-pay

## 2023-07-11 ENCOUNTER — Other Ambulatory Visit (HOSPITAL_BASED_OUTPATIENT_CLINIC_OR_DEPARTMENT_OTHER): Payer: Self-pay

## 2023-08-08 ENCOUNTER — Other Ambulatory Visit (HOSPITAL_BASED_OUTPATIENT_CLINIC_OR_DEPARTMENT_OTHER): Payer: Self-pay

## 2023-08-08 MED ORDER — GOLYTELY 236 G PO SOLR
ORAL | 0 refills | Status: AC
  Filled 2023-08-08: qty 4000, 1d supply, fill #0

## 2023-08-15 DIAGNOSIS — E669 Obesity, unspecified: Secondary | ICD-10-CM | POA: Diagnosis not present

## 2023-08-15 DIAGNOSIS — Z8372 Family history of familial adenomatous polyposis: Secondary | ICD-10-CM | POA: Diagnosis not present

## 2023-08-15 DIAGNOSIS — Z1211 Encounter for screening for malignant neoplasm of colon: Secondary | ICD-10-CM | POA: Diagnosis not present

## 2023-08-15 LAB — HM COLONOSCOPY

## 2023-11-05 ENCOUNTER — Other Ambulatory Visit (HOSPITAL_BASED_OUTPATIENT_CLINIC_OR_DEPARTMENT_OTHER): Payer: Self-pay

## 2023-12-15 ENCOUNTER — Other Ambulatory Visit (HOSPITAL_BASED_OUTPATIENT_CLINIC_OR_DEPARTMENT_OTHER): Payer: Self-pay

## 2023-12-15 DIAGNOSIS — Z0000 Encounter for general adult medical examination without abnormal findings: Secondary | ICD-10-CM | POA: Diagnosis not present

## 2023-12-15 DIAGNOSIS — Z6841 Body Mass Index (BMI) 40.0 and over, adult: Secondary | ICD-10-CM | POA: Diagnosis not present

## 2023-12-15 DIAGNOSIS — Z79899 Other long term (current) drug therapy: Secondary | ICD-10-CM | POA: Diagnosis not present

## 2023-12-15 DIAGNOSIS — Z23 Encounter for immunization: Secondary | ICD-10-CM | POA: Diagnosis not present

## 2023-12-15 DIAGNOSIS — K760 Fatty (change of) liver, not elsewhere classified: Secondary | ICD-10-CM | POA: Diagnosis not present

## 2023-12-15 MED ORDER — WEGOVY 0.25 MG/0.5ML ~~LOC~~ SOAJ
0.5000 mL | SUBCUTANEOUS | 0 refills | Status: DC
  Filled 2023-12-15: qty 2, 28d supply, fill #0

## 2023-12-16 ENCOUNTER — Other Ambulatory Visit (HOSPITAL_BASED_OUTPATIENT_CLINIC_OR_DEPARTMENT_OTHER): Payer: Self-pay

## 2023-12-19 ENCOUNTER — Other Ambulatory Visit (HOSPITAL_BASED_OUTPATIENT_CLINIC_OR_DEPARTMENT_OTHER): Payer: Self-pay

## 2023-12-19 MED ORDER — BETAMETHASONE VALERATE 0.12 % EX FOAM
CUTANEOUS | 11 refills | Status: AC
  Filled 2023-12-19 – 2024-03-07 (×2): qty 50, 30d supply, fill #0

## 2023-12-22 ENCOUNTER — Other Ambulatory Visit (HOSPITAL_BASED_OUTPATIENT_CLINIC_OR_DEPARTMENT_OTHER): Payer: Self-pay

## 2023-12-22 MED ORDER — CLOBETASOL PROPIONATE 0.05 % EX FOAM
CUTANEOUS | 0 refills | Status: AC
  Filled 2023-12-22: qty 50, 30d supply, fill #0

## 2023-12-23 ENCOUNTER — Other Ambulatory Visit: Payer: Self-pay

## 2023-12-23 ENCOUNTER — Other Ambulatory Visit (HOSPITAL_BASED_OUTPATIENT_CLINIC_OR_DEPARTMENT_OTHER): Payer: Self-pay

## 2023-12-23 MED ORDER — BETAMETHASONE VALERATE 0.12 % EX FOAM
1.0000 | CUTANEOUS | 11 refills | Status: AC
  Filled 2023-12-23: qty 50, 30d supply, fill #0
  Filled 2024-01-20: qty 50, 30d supply, fill #1
  Filled 2024-05-11: qty 50, 30d supply, fill #2

## 2023-12-26 ENCOUNTER — Other Ambulatory Visit (HOSPITAL_BASED_OUTPATIENT_CLINIC_OR_DEPARTMENT_OTHER): Payer: Self-pay

## 2023-12-30 ENCOUNTER — Other Ambulatory Visit (HOSPITAL_BASED_OUTPATIENT_CLINIC_OR_DEPARTMENT_OTHER): Payer: Self-pay

## 2023-12-30 MED ORDER — WEGOVY 0.25 MG/0.5ML ~~LOC~~ SOAJ
0.2500 mg | SUBCUTANEOUS | 0 refills | Status: AC
  Filled 2024-01-04 – 2024-01-09 (×2): qty 2, 28d supply, fill #0

## 2024-01-04 ENCOUNTER — Other Ambulatory Visit: Payer: Self-pay

## 2024-01-04 ENCOUNTER — Other Ambulatory Visit (HOSPITAL_BASED_OUTPATIENT_CLINIC_OR_DEPARTMENT_OTHER): Payer: Self-pay

## 2024-01-06 ENCOUNTER — Other Ambulatory Visit (HOSPITAL_BASED_OUTPATIENT_CLINIC_OR_DEPARTMENT_OTHER): Payer: Self-pay

## 2024-01-06 MED ORDER — WEGOVY 0.5 MG/0.5ML ~~LOC~~ SOAJ
0.5000 mg | SUBCUTANEOUS | 0 refills | Status: DC
  Filled 2024-01-06: qty 2, 28d supply, fill #0

## 2024-01-09 ENCOUNTER — Other Ambulatory Visit (HOSPITAL_BASED_OUTPATIENT_CLINIC_OR_DEPARTMENT_OTHER): Payer: Self-pay

## 2024-01-12 ENCOUNTER — Other Ambulatory Visit (HOSPITAL_BASED_OUTPATIENT_CLINIC_OR_DEPARTMENT_OTHER): Payer: Self-pay

## 2024-01-12 MED ORDER — CLINDAMYCIN PHOS-BENZOYL PEROX 1.2-5 % EX GEL
CUTANEOUS | 0 refills | Status: AC
  Filled 2024-01-12: qty 45, 30d supply, fill #0

## 2024-01-20 ENCOUNTER — Other Ambulatory Visit (HOSPITAL_BASED_OUTPATIENT_CLINIC_OR_DEPARTMENT_OTHER): Payer: Self-pay

## 2024-01-20 ENCOUNTER — Other Ambulatory Visit: Payer: Self-pay

## 2024-01-23 ENCOUNTER — Other Ambulatory Visit: Payer: Self-pay

## 2024-01-26 ENCOUNTER — Other Ambulatory Visit (HOSPITAL_BASED_OUTPATIENT_CLINIC_OR_DEPARTMENT_OTHER): Payer: Self-pay

## 2024-02-06 ENCOUNTER — Other Ambulatory Visit (HOSPITAL_BASED_OUTPATIENT_CLINIC_OR_DEPARTMENT_OTHER): Payer: Self-pay

## 2024-02-06 MED ORDER — WEGOVY 0.5 MG/0.5ML ~~LOC~~ SOAJ
0.5000 mg | SUBCUTANEOUS | 0 refills | Status: AC
  Filled 2024-02-06: qty 2, 28d supply, fill #0

## 2024-03-01 ENCOUNTER — Other Ambulatory Visit (HOSPITAL_BASED_OUTPATIENT_CLINIC_OR_DEPARTMENT_OTHER): Payer: Self-pay

## 2024-03-01 MED ORDER — WEGOVY 1 MG/0.5ML ~~LOC~~ SOAJ
1.0000 mg | SUBCUTANEOUS | 0 refills | Status: DC
  Filled 2024-03-01: qty 2, 28d supply, fill #0

## 2024-03-05 ENCOUNTER — Other Ambulatory Visit: Payer: Self-pay | Admitting: Family Medicine

## 2024-03-05 DIAGNOSIS — Z1231 Encounter for screening mammogram for malignant neoplasm of breast: Secondary | ICD-10-CM

## 2024-03-07 ENCOUNTER — Encounter (HOSPITAL_COMMUNITY): Payer: Self-pay | Admitting: Pharmacist

## 2024-03-07 ENCOUNTER — Other Ambulatory Visit (HOSPITAL_BASED_OUTPATIENT_CLINIC_OR_DEPARTMENT_OTHER): Payer: Self-pay

## 2024-03-08 ENCOUNTER — Other Ambulatory Visit (HOSPITAL_BASED_OUTPATIENT_CLINIC_OR_DEPARTMENT_OTHER): Payer: Self-pay

## 2024-03-09 ENCOUNTER — Other Ambulatory Visit (HOSPITAL_BASED_OUTPATIENT_CLINIC_OR_DEPARTMENT_OTHER): Payer: Self-pay

## 2024-03-16 ENCOUNTER — Other Ambulatory Visit (HOSPITAL_BASED_OUTPATIENT_CLINIC_OR_DEPARTMENT_OTHER): Payer: Self-pay

## 2024-03-16 DIAGNOSIS — L409 Psoriasis, unspecified: Secondary | ICD-10-CM | POA: Diagnosis not present

## 2024-03-16 DIAGNOSIS — Z23 Encounter for immunization: Secondary | ICD-10-CM | POA: Diagnosis not present

## 2024-03-16 DIAGNOSIS — Z6841 Body Mass Index (BMI) 40.0 and over, adult: Secondary | ICD-10-CM | POA: Diagnosis not present

## 2024-03-16 MED ORDER — WEGOVY 1 MG/0.5ML ~~LOC~~ SOAJ
1.0000 mg | SUBCUTANEOUS | 0 refills | Status: DC
  Filled 2024-03-16: qty 2, 28d supply, fill #0

## 2024-03-30 ENCOUNTER — Other Ambulatory Visit (HOSPITAL_BASED_OUTPATIENT_CLINIC_OR_DEPARTMENT_OTHER): Payer: Self-pay

## 2024-03-30 MED ORDER — WEGOVY 1.7 MG/0.75ML ~~LOC~~ SOAJ
1.7000 mg | SUBCUTANEOUS | 0 refills | Status: AC
  Filled 2024-03-30: qty 3, 28d supply, fill #0
  Filled 2024-04-27: qty 3, 28d supply, fill #1

## 2024-04-09 ENCOUNTER — Ambulatory Visit

## 2024-04-16 ENCOUNTER — Ambulatory Visit
Admission: RE | Admit: 2024-04-16 | Discharge: 2024-04-16 | Disposition: A | Source: Ambulatory Visit | Attending: Family Medicine

## 2024-04-16 DIAGNOSIS — Z1231 Encounter for screening mammogram for malignant neoplasm of breast: Secondary | ICD-10-CM | POA: Diagnosis not present

## 2024-04-20 ENCOUNTER — Ambulatory Visit: Payer: Self-pay | Admitting: Obstetrics and Gynecology

## 2024-04-23 ENCOUNTER — Ambulatory Visit: Payer: BC Managed Care – PPO | Admitting: Obstetrics and Gynecology

## 2024-04-23 NOTE — Progress Notes (Deleted)
 53 y.o. [id number] female s/p TAH (2005 for fibroids) with GSM, breast implants, goiter (followed by endocrine) here for annual exam. Long term partner. Works in CONSULTING CIVIL ENGINEER. PCP: Verena Mems, MD  Patient's last menstrual period was 10/20/2003 (exact date).   She reports ***  Abnormal bleeding: None Pelvic discharge or pain: None  Breast mass, nipple discharge or skin changes : None  Last PAP: not indicated Last mammogram: 04/11/23 BIRADS 1, density  Last colonoscopy: 05/29/18 normal f/u 5 years   Sexually active: yes  Exercising: no Smoker:  no   Garment/textile Technologist Visit from 04/21/2023 in [facility]  PHQ-2 Total Score 0   Flowsheet Row Office Visit from 04/21/2023 in [facility]  PHQ-9 Total Score 0    GYN HISTORY: Prior hysterectomy for fibroids  OB History  Gravida Para Term Preterm AB Living  3 1 1  0 2 1  SAB IAB Ectopic Multiple Live Births  2 0 0 0 1    # Outcome Date GA Lbr Len/2nd Weight Sex Type Anes PTL Lv  3 SAB           2 SAB           1 Term     F Vag-Spont   LIV    Past Medical History:  Diagnosis Date   Allergy    Fibroid 10/29/2003   hx of--removed with hyst.   Thyroid  nodule 07/26/2008   both sides,  right thyroid  biopsy X 2 07/2008 & 06/2013 benign   Viral meningitis 1990    Past Surgical History:  Procedure Laterality Date   ABDOMINAL HYSTERECTOMY  10-29-03   TAH d/t fibroids--Dr. Macario Sharps   AUGMENTATION MAMMAPLASTY Bilateral 05-02-2009   w/lift--[facility]   BIOPSY THYROID  Right 08/14/2008 & 07/11/2013   needle biopsy done on rt side - benign   BREAST BIOPSY Right 2003   benign    Current Outpatient Medications on File Prior to Visit  Medication Sig Dispense Refill   Betamethasone  Valerate 0.12 % foam Apply to affected area twice weekly as needed. 50 g 11   Betamethasone  Valerate 0.12 % foam Apply topically to affected area 2 (two) times a week as needed. 50 g 11    Betamethasone  Valerate 0.12 % foam Apply 1 Application to affected area topically 2 (two) times a week as needed 50 g 11   Clindamycin -Benzoyl Per, Refr, gel Apply 1 gram to skin once a day as directed 45 g 0   clobetasol  (OLUX ) 0.05 % topical foam Apply to scalp twice weekly as directed 50 g 0   estradiol  (ESTRACE  VAGINAL) 0.1 MG/GM vaginal cream Apply 1/2 gram to vulva nightly for 2 weeks then decrease to 1/2 gram to vulva two nights a week. 42.5 g 1   loratadine (CLARITIN) 10 MG tablet Take 10 mg by mouth as needed.      Multiple Vitamin (MULTIVITAMIN) capsule Take 1 capsule by mouth daily.     polyethylene glycol (GOLYTELY ) 236 g solution Take as directed 4000 mL 0   semaglutide -weight management (WEGOVY ) 0.25 MG/0.5ML SOAJ SQ injection Inject 0.25 mg into the skin every 7 (seven) days. 2 mL 0   semaglutide -weight management (WEGOVY ) 0.5 MG/0.5ML SOAJ SQ injection Inject 0.5 mg into the skin once a week. 2 mL 0   semaglutide -weight management (WEGOVY ) 1.7 MG/0.75ML SOAJ SQ injection Inject 1.7 mg into the skin once a week. 6 mL 0   valACYclovir  (VALTREX ) 1000 MG tablet  Take 2 tablets (2,000 mg total) by mouth every 12 (twelve) hours for 1 day as needed. 90 tablet 0   No current facility-administered medications on file prior to visit.    Social History   Socioeconomic History   Marital status: Divorced    Spouse name: Not on file   Number of children: Not on file   Years of education: Not on file   Highest education level: Not on file  Occupational History   Not on file  Tobacco Use   Smoking status: Never   Smokeless tobacco: Never  Vaping Use   Vaping status: Never Used  Substance and Sexual Activity   Alcohol use: Yes    Comment: soccially   Drug use: No   Sexual activity: Yes    Partners: Male    Birth control/protection: Surgical    Comment: TAH--still has ovaries  Other Topics Concern   Not on file  Social History Narrative   Lives alone. Work at [employer] and does some  international travel.   Social Drivers of Corporate Investment Banker Strain: Not on file  Food Insecurity: Not on file  Transportation Needs: Not on file  Physical Activity: Not on file  Stress: Not on file  Social Connections: Not on file  Intimate Partner Violence: Not on file    Family History  Problem Relation Age of Onset   Hypertension Mother    Diabetes Mother    Colon cancer Father    Diabetes Father    Hypertension Father    Kidney Stones Sister    Breast cancer Maternal Aunt 45       bilateral mastectomy, no BRCA testing   Colon cancer Paternal Aunt        dx'd age 63-died age 48   Colon cancer Paternal Aunt        dx'd 56 stage IV   Colon cancer Paternal Aunt        dx'd late 21's, died age 50   Hypertension Maternal Grandmother    Hypertension Maternal Grandfather    Prostate cancer Maternal Grandfather    Hypertension Paternal Grandmother    Lung cancer Paternal Grandfather        smoker   Hypertension Paternal Grandfather    Breast cancer Cousin     No Known Allergies    PE There were no vitals filed for this visit.  There is no height or weight on file to calculate BMI.  Physical Exam Vitals reviewed. Exam conducted with a chaperone present.  Constitutional:      General: She is not in acute distress.    Appearance: Normal appearance.  HENT:     Head: Normocephalic and atraumatic.     Nose: Nose normal.  Eyes:     Extraocular Movements: Extraocular movements intact.     Conjunctiva/sclera: Conjunctivae normal.  Pulmonary:     Effort: Pulmonary effort is normal.  Chest:     Chest wall: No mass or tenderness.  Breasts:    Right: Normal. No swelling, mass, nipple discharge or tenderness.     Left: Normal. No swelling, mass, nipple discharge or tenderness.  Abdominal:     General: There is no distension.     Palpations: Abdomen is soft.     Tenderness: There is no abdominal tenderness.  Genitourinary:    General: Normal vulva.      Exam position: Lithotomy position.     Urethra: No prolapse.     Vagina: Normal. No vaginal discharge  or bleeding.     Cervix: No lesion.     Adnexa: Right adnexa normal and left adnexa normal.     Comments: Cervix and uterus absent Musculoskeletal:        General: Normal range of motion.     Cervical back: Normal range of motion.  Lymphadenopathy:     Upper Body:     Right upper body: No axillary adenopathy.     Left upper body: No axillary adenopathy.     Lower Body: No right inguinal adenopathy. No left inguinal adenopathy.  Skin:    General: Skin is warm and dry.  Neurological:     General: No focal deficit present.     Mental Status: She is alert.  Psychiatric:        Mood and Affect: Mood normal.        Behavior: Behavior normal.      Assessment and Plan:        There are no diagnoses linked to this encounter.  Vera LULLA Pa, MD

## 2024-04-27 ENCOUNTER — Other Ambulatory Visit (HOSPITAL_BASED_OUTPATIENT_CLINIC_OR_DEPARTMENT_OTHER): Payer: Self-pay

## 2024-04-27 MED ORDER — WEGOVY 2.4 MG/0.75ML ~~LOC~~ SOAJ
2.4000 mg | SUBCUTANEOUS | 0 refills | Status: AC
  Filled 2024-04-27: qty 3, 28d supply, fill #0
  Filled 2024-05-23: qty 3, 28d supply, fill #1

## 2024-05-11 ENCOUNTER — Other Ambulatory Visit (HOSPITAL_BASED_OUTPATIENT_CLINIC_OR_DEPARTMENT_OTHER): Payer: Self-pay

## 2024-05-23 ENCOUNTER — Other Ambulatory Visit (HOSPITAL_BASED_OUTPATIENT_CLINIC_OR_DEPARTMENT_OTHER): Payer: Self-pay

## 2024-06-02 ENCOUNTER — Other Ambulatory Visit (HOSPITAL_BASED_OUTPATIENT_CLINIC_OR_DEPARTMENT_OTHER): Payer: Self-pay

## 2024-06-21 ENCOUNTER — Ambulatory Visit: Admitting: Obstetrics and Gynecology

## 2024-08-09 ENCOUNTER — Ambulatory Visit: Admitting: Obstetrics and Gynecology
